# Patient Record
Sex: Female | Born: 1975 | Race: White | Hispanic: No | Marital: Married | Smoking: Former smoker
Health system: Southern US, Community
[De-identification: ages and names within clinical notes are randomized; demographics above are authoritative.]

## PROBLEM LIST (undated history)

## (undated) DIAGNOSIS — F32A Depression, unspecified: Secondary | ICD-10-CM

## (undated) DIAGNOSIS — N390 Urinary tract infection, site not specified: Secondary | ICD-10-CM

## (undated) DIAGNOSIS — F419 Anxiety disorder, unspecified: Secondary | ICD-10-CM

## (undated) DIAGNOSIS — F329 Major depressive disorder, single episode, unspecified: Secondary | ICD-10-CM

## (undated) HISTORY — DX: Anxiety disorder, unspecified: F41.9

## (undated) HISTORY — DX: Depression, unspecified: F32.A

## (undated) HISTORY — PX: OTHER SURGICAL HISTORY: SHX169

## (undated) HISTORY — DX: Major depressive disorder, single episode, unspecified: F32.9

---

## 1996-12-24 HISTORY — PX: OTHER SURGICAL HISTORY: SHX169

## 1998-10-28 ENCOUNTER — Other Ambulatory Visit: Admission: RE | Admit: 1998-10-28 | Discharge: 1998-10-28 | Payer: Self-pay | Admitting: Gynecology

## 2007-12-25 HISTORY — PX: TUBAL LIGATION: SHX77

## 2011-12-25 HISTORY — PX: WISDOM TOOTH EXTRACTION: SHX21

## 2013-07-01 ENCOUNTER — Ambulatory Visit (INDEPENDENT_AMBULATORY_CARE_PROVIDER_SITE_OTHER): Payer: BC Managed Care – PPO

## 2013-07-01 ENCOUNTER — Encounter: Payer: Self-pay | Admitting: Obstetrics & Gynecology

## 2013-07-01 ENCOUNTER — Ambulatory Visit (INDEPENDENT_AMBULATORY_CARE_PROVIDER_SITE_OTHER): Payer: BC Managed Care – PPO | Admitting: Obstetrics & Gynecology

## 2013-07-01 VITALS — BP 124/83 | HR 90 | Resp 16 | Ht 69.5 in | Wt 172.0 lb

## 2013-07-01 DIAGNOSIS — Z1151 Encounter for screening for human papillomavirus (HPV): Secondary | ICD-10-CM

## 2013-07-01 DIAGNOSIS — Z01419 Encounter for gynecological examination (general) (routine) without abnormal findings: Secondary | ICD-10-CM

## 2013-07-01 DIAGNOSIS — N939 Abnormal uterine and vaginal bleeding, unspecified: Secondary | ICD-10-CM | POA: Insufficient documentation

## 2013-07-01 DIAGNOSIS — Z124 Encounter for screening for malignant neoplasm of cervix: Secondary | ICD-10-CM

## 2013-07-01 DIAGNOSIS — N926 Irregular menstruation, unspecified: Secondary | ICD-10-CM

## 2013-07-01 NOTE — Addendum Note (Signed)
Addended by: Mariel Aloe L on: 07/01/2013 12:00 PM   Modules accepted: Orders

## 2013-07-01 NOTE — Progress Notes (Signed)
  Subjective:     Diane Elliott is a 37 y.o. female here for a routine exam.  Current complaints: painful menses x 9 months.  Pt has also been having iirgular cycles for 2 months.  The last period began 6/27 and has continued and is still present today.  Pt has a day of no bleeding then a gush of blood the next.  Pt reports one episode of post coital bleeding..  Personal health questionnaire reviewed: yes.   Gynecologic History Patient's last menstrual period was 06/18/2013. Contraception: tubal ligation Last Pap: 2008. Results were: normal Last mammogram: n/a. Obstetric History OB History   Grav Para Term Preterm Abortions TAB SAB Ect Mult Living   3 3        3      # Outc Date GA Lbr Len/2nd Wgt Sex Del Anes PTL Lv   1 PAR            2 PAR            3 PAR                The following portions of the patient's history were reviewed and updated as appropriate: allergies, current medications, past family history, past medical history, past social history, past surgical history and problem list.  Review of Systems Pertinent items are noted in HPI.    Objective:   Filed Vitals:   07/01/13 1007  BP: 124/83  Pulse: 90  Resp: 16  Height: 5' 9.5" (1.765 m)  Weight: 172 lb (78.019 kg)      Vitals:  WNL General appearance: alert, cooperative and no distress Head: Normocephalic, without obvious abnormality, atraumatic Eyes: negative Throat: lips, mucosa, and tongue normal; teeth and gums normal Lungs: clear to auscultation bilaterally Breasts: normal appearance, no masses or tenderness, No nipple retraction or dimpling, No nipple discharge or bleeding Heart: regular rate and rhythm Abdomen: soft, non-tender; bowel sounds normal; no masses,  no organomegaly Pelvic: cervix normal in appearance, external genitalia normal, no adnexal masses or tenderness, no bladder tenderness, no cervical motion tenderness, perianal skin: no external genital warts noted, urethra without abnormality  or discharge, uterus normal size, shape, and consistency and vagina normal without discharge--small amount of blood in vault Extremities: no edema, redness or tenderness in the calves or thighs Skin: no lesions or rash--pt is sunburned Lymph nodes: Axillary adenopathy: none        Assessment:    Healthy female exam.  Abnormal uterine bleeding Smoker   Plan:    Education reviewed: self breast exams, skin cancer screening and smoking cessation. Contraception: tubal ligation. Follow up in: 2 weeks. TV US to evaluate for abnml uterine bleeding

## 2013-07-08 ENCOUNTER — Telehealth: Payer: Self-pay | Admitting: *Deleted

## 2013-07-08 ENCOUNTER — Ambulatory Visit (INDEPENDENT_AMBULATORY_CARE_PROVIDER_SITE_OTHER): Payer: BC Managed Care – PPO | Admitting: Obstetrics & Gynecology

## 2013-07-08 ENCOUNTER — Encounter: Payer: Self-pay | Admitting: Obstetrics & Gynecology

## 2013-07-08 ENCOUNTER — Encounter (HOSPITAL_COMMUNITY): Payer: Self-pay | Admitting: Pharmacist

## 2013-07-08 VITALS — BP 111/71 | HR 71 | Resp 16 | Ht 69.5 in | Wt 175.0 lb

## 2013-07-08 DIAGNOSIS — Z01818 Encounter for other preprocedural examination: Secondary | ICD-10-CM

## 2013-07-08 DIAGNOSIS — N840 Polyp of corpus uteri: Secondary | ICD-10-CM | POA: Insufficient documentation

## 2013-07-08 MED ORDER — MEDROXYPROGESTERONE ACETATE 10 MG PO TABS: 10.0000 mg | ORAL_TABLET | Freq: Every day | ORAL | Status: DC

## 2013-07-08 MED ORDER — MISOPROSTOL 200 MCG PO TABS: ORAL_TABLET | ORAL | Status: DC

## 2013-07-08 NOTE — Telephone Encounter (Signed)
Per TO Dr Penne Lash, pt notified to take Provera 10 mg 2 daily until surgery and RX for Cytotec 400 MCG to be placed vaginally the night prior to procedure.  This was sent to [facility] in [facility].

## 2013-07-08 NOTE — Progress Notes (Signed)
Pt presents for f/u of menorrhagia.  US shows probably 1.4 cm endometrial polyp.  Pt is still bleeding.  We discussed surgical removal.  Pt would like to proceed.  Risks include but not limited to bleeding, infection, and damage to the uterus.  Pt had nml menses until recently.  She is not anovulatory.    Also of not, pt has a probable hemorrhagic cyst on the left ovary as below.   Will follow up in 8 weeks.  Technique: Transvaginal ultrasound examination of the pelvis was  performed including evaluation of the uterus, ovaries, adnexal  regions, and pelvic cul-de-sac.  Comparison: None.  Findings:  Uterus: Normal in size and appearance, measuring 8.6 x 4.3 x 5.2  cm.  Endometrium: 14 x 7 x 14 mm echogenic lesion within the endometrium  with associated color Doppler flow, worrisome for an endometrial  polyp. Additional fluid/debris within the endometrial cavity.  Right ovary: Normal appearance/no adnexal mass, measuring 3.6 x 2.3  x 2.9 cm.  Left ovary: Measures 5.0 x 2.8 x 3.5 cm. Associated 2.4 x 2.1 x  2.0 cm complex cyst, likely a benign hemorrhagic cyst, although  technically indeterminate given lack of a characteristic ultrasound  appearance.  Other Findings: No free fluid.  IMPRESSION:  Suspected 1.4 cm endometrial polyp with associated color Doppler  flow.  Consider further evaluation with sonohysterogram for confirmation  prior to hysteroscopy. Endometrial sampling should also be  considered if patient is at high risk for endometrial carcinoma.  (Ref: Radiological Reasoning: Algorithmic Workup of Abnormal  Vaginal Bleeding with Endovaginal Sonography and Sonohysterography.  AJR 2008; [id number]:[id number])  2.4 cm probable left ovarian complex/hemorrhagic cyst. Consider  follow-up pelvic US in 6-12 weeks as clinically warranted.  Surgery to be scheduled for next week.  Will have tru clear available if polyp is large or lesion is a submucosal myoma.  25 minutes spent face to face with  pt with >50% counseling

## 2013-07-13 ENCOUNTER — Encounter (HOSPITAL_COMMUNITY)
Admission: RE | Disposition: A | Discharge status: Home / Self Care | Payer: Self-pay | Source: Ambulatory Visit | Attending: Obstetrics & Gynecology

## 2013-07-13 ENCOUNTER — Encounter (HOSPITAL_COMMUNITY): Payer: Self-pay | Admitting: Anesthesiology

## 2013-07-13 ENCOUNTER — Ambulatory Visit (HOSPITAL_COMMUNITY)
Admission: RE | Admit: 2013-07-13 | Discharge: 2013-07-13 | Disposition: A | Discharge status: Home / Self Care | Payer: BC Managed Care – PPO | Source: Ambulatory Visit | Attending: Obstetrics & Gynecology | Admitting: Obstetrics & Gynecology

## 2013-07-13 ENCOUNTER — Encounter (HOSPITAL_COMMUNITY): Payer: Self-pay | Admitting: *Deleted

## 2013-07-13 ENCOUNTER — Ambulatory Visit (HOSPITAL_COMMUNITY): Payer: BC Managed Care – PPO | Admitting: Anesthesiology

## 2013-07-13 DIAGNOSIS — N920 Excessive and frequent menstruation with regular cycle: Secondary | ICD-10-CM | POA: Insufficient documentation

## 2013-07-13 DIAGNOSIS — N926 Irregular menstruation, unspecified: Secondary | ICD-10-CM

## 2013-07-13 DIAGNOSIS — F411 Generalized anxiety disorder: Secondary | ICD-10-CM | POA: Insufficient documentation

## 2013-07-13 DIAGNOSIS — N939 Abnormal uterine and vaginal bleeding, unspecified: Secondary | ICD-10-CM

## 2013-07-13 DIAGNOSIS — F329 Major depressive disorder, single episode, unspecified: Secondary | ICD-10-CM | POA: Insufficient documentation

## 2013-07-13 DIAGNOSIS — F17200 Nicotine dependence, unspecified, uncomplicated: Secondary | ICD-10-CM | POA: Insufficient documentation

## 2013-07-13 DIAGNOSIS — F3289 Other specified depressive episodes: Secondary | ICD-10-CM | POA: Insufficient documentation

## 2013-07-13 DIAGNOSIS — N840 Polyp of corpus uteri: Secondary | ICD-10-CM | POA: Insufficient documentation

## 2013-07-13 LAB — CBC
MCV: 87.6 fL (ref 78.0–100.0)
Platelets: 364 10*3/uL (ref 150–400)
RDW: 13.5 % (ref 11.5–15.5)
WBC: 9.4 10*3/uL (ref 4.0–10.5)

## 2013-07-13 SURGERY — DILATATION & CURETTAGE/HYSTEROSCOPY WITH TRUCLEAR
Anesthesia: General | Site: Uterus | Wound class: Clean Contaminated

## 2013-07-13 MED ORDER — MEPERIDINE HCL 25 MG/ML IJ SOLN: 6.2500 mg | INTRAMUSCULAR | Status: DC | PRN

## 2013-07-13 MED ORDER — KETOROLAC TROMETHAMINE 30 MG/ML IJ SOLN
INTRAMUSCULAR | Status: AC
  Filled 2013-07-13: qty 1

## 2013-07-13 MED ORDER — LIDOCAINE HCL (CARDIAC) 20 MG/ML IV SOLN
INTRAVENOUS | Status: AC
  Filled 2013-07-13: qty 5

## 2013-07-13 MED ORDER — KETOROLAC TROMETHAMINE 30 MG/ML IJ SOLN
INTRAMUSCULAR | Status: DC | PRN
  Administered 2013-07-13: 30 mg via INTRAVENOUS

## 2013-07-13 MED ORDER — ONDANSETRON HCL 4 MG/2ML IJ SOLN
INTRAMUSCULAR | Status: AC
  Filled 2013-07-13: qty 2

## 2013-07-13 MED ORDER — METOCLOPRAMIDE HCL 5 MG/ML IJ SOLN: 10.0000 mg | Freq: Once | INTRAMUSCULAR | Status: DC | PRN

## 2013-07-13 MED ORDER — SILVER NITRATE-POT NITRATE 75-25 % EX MISC
CUTANEOUS | Status: AC
  Filled 2013-07-13: qty 1

## 2013-07-13 MED ORDER — DEXAMETHASONE SODIUM PHOSPHATE 10 MG/ML IJ SOLN
INTRAMUSCULAR | Status: AC
  Filled 2013-07-13: qty 1

## 2013-07-13 MED ORDER — PROPOFOL 10 MG/ML IV EMUL
INTRAVENOUS | Status: AC
  Filled 2013-07-13: qty 20

## 2013-07-13 MED ORDER — IBUPROFEN 600 MG PO TABS: 600.0000 mg | ORAL_TABLET | Freq: Four times a day (QID) | ORAL | Status: DC | PRN

## 2013-07-13 MED ORDER — MIDAZOLAM HCL 5 MG/5ML IJ SOLN
INTRAMUSCULAR | Status: DC | PRN
  Administered 2013-07-13: 2 mg via INTRAVENOUS

## 2013-07-13 MED ORDER — SILVER NITRATE-POT NITRATE 75-25 % EX MISC
CUTANEOUS | Status: DC | PRN
  Administered 2013-07-13: 1

## 2013-07-13 MED ORDER — DEXAMETHASONE SODIUM PHOSPHATE 4 MG/ML IJ SOLN
INTRAMUSCULAR | Status: DC | PRN
  Administered 2013-07-13: 10 mg via INTRAVENOUS

## 2013-07-13 MED ORDER — FENTANYL CITRATE 0.05 MG/ML IJ SOLN
INTRAMUSCULAR | Status: DC | PRN
  Administered 2013-07-13: 100 ug via INTRAVENOUS
  Administered 2013-07-13 (×2): 50 ug via INTRAVENOUS

## 2013-07-13 MED ORDER — MIDAZOLAM HCL 2 MG/2ML IJ SOLN
INTRAMUSCULAR | Status: AC
  Filled 2013-07-13: qty 2

## 2013-07-13 MED ORDER — FENTANYL CITRATE 0.05 MG/ML IJ SOLN: 25.0000 ug | INTRAMUSCULAR | Status: DC | PRN

## 2013-07-13 MED ORDER — FENTANYL CITRATE 0.05 MG/ML IJ SOLN
INTRAMUSCULAR | Status: AC
  Filled 2013-07-13: qty 5

## 2013-07-13 MED ORDER — LACTATED RINGERS IV SOLN
INTRAVENOUS | Status: DC
  Administered 2013-07-13 (×2): via INTRAVENOUS
  Administered 2013-07-13: 125 mL/h via INTRAVENOUS

## 2013-07-13 MED ORDER — ONDANSETRON HCL 4 MG/2ML IJ SOLN
INTRAMUSCULAR | Status: DC | PRN
  Administered 2013-07-13: 4 mg via INTRAVENOUS

## 2013-07-13 MED ORDER — SODIUM CHLORIDE 0.9 % IR SOLN
Status: DC | PRN
  Administered 2013-07-13: 3000 mL

## 2013-07-13 MED ORDER — FENTANYL CITRATE 0.05 MG/ML IJ SOLN
INTRAMUSCULAR | Status: AC
  Filled 2013-07-13: qty 2

## 2013-07-13 MED ORDER — LIDOCAINE HCL (CARDIAC) 20 MG/ML IV SOLN
INTRAVENOUS | Status: DC | PRN
  Administered 2013-07-13: 80 mg via INTRAVENOUS

## 2013-07-13 MED ORDER — PROPOFOL 10 MG/ML IV BOLUS
INTRAVENOUS | Status: DC | PRN
  Administered 2013-07-13: 200 mg via INTRAVENOUS

## 2013-07-13 SURGICAL SUPPLY — 17 items
BLADE INCISOR TRUC PLUS 2.9 (ABLATOR) ×1 IMPLANT
CATH ROBINSON RED A/P 16FR (CATHETERS) ×2 IMPLANT
CLOTH BEACON ORANGE TIMEOUT ST (SAFETY) ×2 IMPLANT
CONTAINER PREFILL 10% NBF 60ML (FORM) IMPLANT
DRESSING TELFA 8X3 (GAUZE/BANDAGES/DRESSINGS) ×2 IMPLANT
GLOVE BIO SURGEON STRL SZ7 (GLOVE) ×2 IMPLANT
GLOVE BIOGEL PI IND STRL 7.0 (GLOVE) ×1 IMPLANT
GLOVE BIOGEL PI INDICATOR 7.0 (GLOVE) ×1
GOWN STRL REIN XL XLG (GOWN DISPOSABLE) ×4 IMPLANT
INCISOR TRUC PLUS BLADE 2.9 (ABLATOR) ×2
KIT HYSTEROSCOPY TRUCLEAR (ABLATOR) ×2 IMPLANT
NEEDLE SPNL 22GX3.5 QUINCKE BK (NEEDLE) ×2 IMPLANT
PACK VAGINAL MINOR WOMEN LF (CUSTOM PROCEDURE TRAY) ×2 IMPLANT
PAD OB MATERNITY 4.3X12.25 (PERSONAL CARE ITEMS) ×2 IMPLANT
PAD PREP 24X48 CUFFED NSTRL (MISCELLANEOUS) ×2 IMPLANT
SYR CONTROL 10ML LL (SYRINGE) ×2 IMPLANT
TOWEL OR 17X24 6PK STRL BLUE (TOWEL DISPOSABLE) ×4 IMPLANT

## 2013-07-13 NOTE — H&P (Signed)
  Full H&P was done 07/01/13.  Pt noted to have endometrial polyp on TVUS.  Pt consented for D&C, hysteroscopy, and polyp removal.  Risk include but not limited to bleeding, infection, damage to the uterus.  If peroration occurs, the procedure will have to be discontinued.  Pt understands all the above.  All questions answered.  Pt feels well today.  No complaints.  No changes to H & P.  LEGGETT,KELLY H. 2:11 PM

## 2013-07-13 NOTE — Anesthesia Postprocedure Evaluation (Signed)
Anesthesia Post Note  Patient: Diane Elliott  Procedure(s) Performed: Procedure(s) (LRB): DILATATION & CURETTAGE/HYSTEROSCOPY WITH TRUECLEAR WITH CERVICAL POLYPECTOMY (N/A)  Anesthesia type: General  Patient location: PACU  Post pain: Pain level controlled  Post assessment: Post-op Vital signs reviewed  Last Vitals:  Filed Vitals:   07/13/13 1620  BP: 100/55  Temp: 37.2 C  Resp: 16    Post vital signs: Reviewed  Level of consciousness: sedated  Complications: No apparent anesthesia complications

## 2013-07-13 NOTE — Op Note (Signed)
PREOPERATIVE DIAGNOSIS:  37 yo female with menorrhagia and suspected endometrial polyp  POSTOPERATIVE DIAGNOSIS: The same  PROCEDURE:  D & C, Hysteroscopy, Polypectomy with Tru Clear  SURGEON:  Dr. Elsie Lincoln  INDICATIONS: 37 y.o. yo G3P3  here for menorrhagia and suspected endometrial polyp   .Risks of surgery were discussed with the patient including but not limited to: bleeding which may require transfusion; infection which may require antibiotics; injury to uterus leading to risk of injury to surrounding intraperitoneal organs, need for additional procedures including laparoscopy or laparotomy, and other postoperative/anesthesia complications. Written informed consent was obtained.   FINDINGS:  A 6 size anteverted/midline/retroverted uterus.  Nml ostia bilaterally.  Polyp arising from left side of LUS.  Patient was taken to the operating room where general anesthesia was administered and was found to be adequate.  After an adequate timeout was performed, she was placed in the dorsal lithotomy position and examined; then prepped and draped in the sterile manner.   Her bladder was catheterized for an unmeasured amount of clear, yellow urine. A speculum was then placed in the patient's vagina and a single tooth tenaculum was applied to the anterior lip of the cervix.  The cervix was dilated manually with half-sized Hegar dilators to accommodate the 8 mm hysteroscope.  Once the cervix was dilated, the hysteroscope was inserted under direct visualization. The uterine cavity was carefully examined, both ostia were recognized, and there was minimal endometrial tissue.  A cervical seal test was conducted and there was no fluid loss.  The Tru Clear device was used to remove the polyp from the left wall of the endometrial cavity.  Total resection time was 30 seconds.  Thre was excellent hemostasis. The hysteroscope was removed. The tenaculum was removed from the anterior lip of the cervix, and the vaginal  speculum was removed after noting good hemostasis.  The patient tolerated the procedure well and was taken to the recovery area awake, extubated and in stable condition.  The patient will be discharged to home as per PACU criteria.  Routine postoperative instructions given.  She will follow up in my office in 2-3 weeks for postoperative evaluation .

## 2013-07-13 NOTE — Transfer of Care (Signed)
Immediate Anesthesia Transfer of Care Note  Patient: Diane Elliott  Procedure(s) Performed: Procedure(s): DILATATION & CURETTAGE/HYSTEROSCOPY WITH TRUECLEAR WITH CERVICAL POLYPECTOMY (N/A)  Patient Location: PACU  Anesthesia Type:General  Level of Consciousness: awake, alert  and oriented  Airway & Oxygen Therapy: Patient Spontanous Breathing and Patient connected to nasal cannula oxygen  Post-op Assessment: Report given to PACU RN and Post -op Vital signs reviewed and stable  Post vital signs: Reviewed and stable  Complications: No apparent anesthesia complications

## 2013-07-13 NOTE — Anesthesia Procedure Notes (Signed)
Procedure Name: LMA Insertion Date/Time: 07/13/2013 3:40 PM Performed by: Graciela Husbands Pre-anesthesia Checklist: Timeout performed, Suction available, Emergency Drugs available, Patient identified and Patient being monitored Patient Re-evaluated:Patient Re-evaluated prior to inductionOxygen Delivery Method: Circle system utilized Preoxygenation: Pre-oxygenation with 100% oxygen Intubation Type: IV induction LMA: LMA inserted LMA Size: 4.0 Number of attempts: 1 Placement Confirmation: positive ETCO2 and breath sounds checked- equal and bilateral Tube secured with: Tape Dental Injury: Teeth and Oropharynx as per pre-operative assessment

## 2013-07-13 NOTE — Anesthesia Preprocedure Evaluation (Signed)
Anesthesia Evaluation  Patient identified by MRN, date of birth, ID band Patient awake    Reviewed: Allergy & Precautions, H&P , NPO status , Patient's Chart, lab work & pertinent test results  Airway Mallampati: II TM Distance: >3 FB Neck ROM: Full    Dental no notable dental hx. (+) Teeth Intact   Pulmonary Current Smoker,  breath sounds clear to auscultation  Pulmonary exam normal       Cardiovascular negative cardio ROS  Rhythm:Regular Rate:Normal     Neuro/Psych PSYCHIATRIC DISORDERS Anxiety Depression negative neurological ROS     GI/Hepatic negative GI ROS, Neg liver ROS,   Endo/Other  negative endocrine ROS  Renal/GU negative Renal ROS  negative genitourinary   Musculoskeletal negative musculoskeletal ROS (+)   Abdominal   Peds  Hematology negative hematology ROS (+)   Anesthesia Other Findings   Reproductive/Obstetrics Endometrial polyp                           Anesthesia Physical Anesthesia Plan  ASA: II  Anesthesia Plan: General   Post-op Pain Management:    Induction: Intravenous  Airway Management Planned: LMA  Additional Equipment:   Intra-op Plan:   Post-operative Plan: Extubation in OR  Informed Consent: I have reviewed the patients History and Physical, chart, labs and discussed the procedure including the risks, benefits and alternatives for the proposed anesthesia with the patient or authorized representative who has indicated his/her understanding and acceptance.   Dental advisory given  Plan Discussed with: Anesthesiologist, CRNA and Surgeon  Anesthesia Plan Comments:         Anesthesia Quick Evaluation

## 2013-07-16 ENCOUNTER — Telehealth: Payer: Self-pay | Admitting: *Deleted

## 2013-07-16 NOTE — Telephone Encounter (Signed)
Pt notified of normal pathology from her polypectomy

## 2013-07-16 NOTE — Telephone Encounter (Signed)
Message copied by Granville Lewis on Thu Jul 16, 2013 10:23 AM ------      Message from: Lesly Dukes      Created: Wed Jul 15, 2013 10:37 PM       Benign pathology.  rn to call pt ------

## 2014-01-14 IMAGING — US US TRANSVAGINAL NON-OB
1 series · 13 of 25 positions shown · non-contrast
Comparison: None.

CLINICAL DATA: Abnormal uterine bleeding

TRANSVAGINAL ULTRASOUND OF PELVIS
TECHNIQUE: Transvaginal ultrasound examination of the pelvis was
performed including evaluation of the uterus, ovaries, adnexal
regions, and pelvic cul-de-sac.

[Series 1: us transvaginal non-ob · 0.15mm/px · 38 acquisitions, 13 frames shown]
[im 1/38]
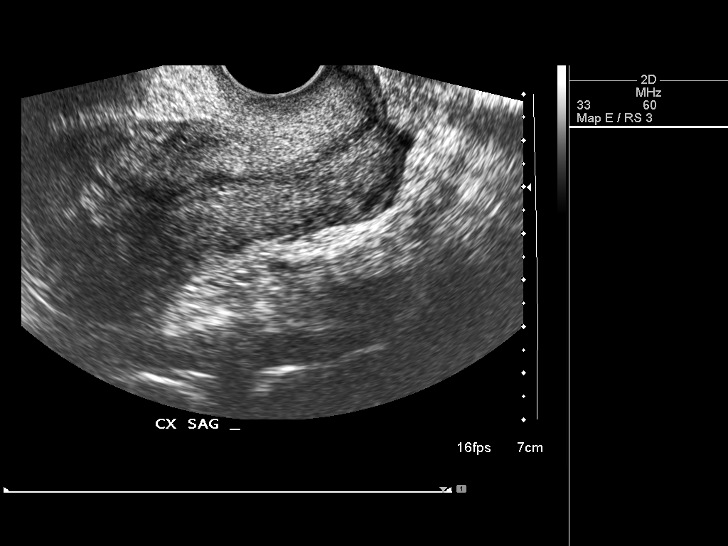
[im 4/38]
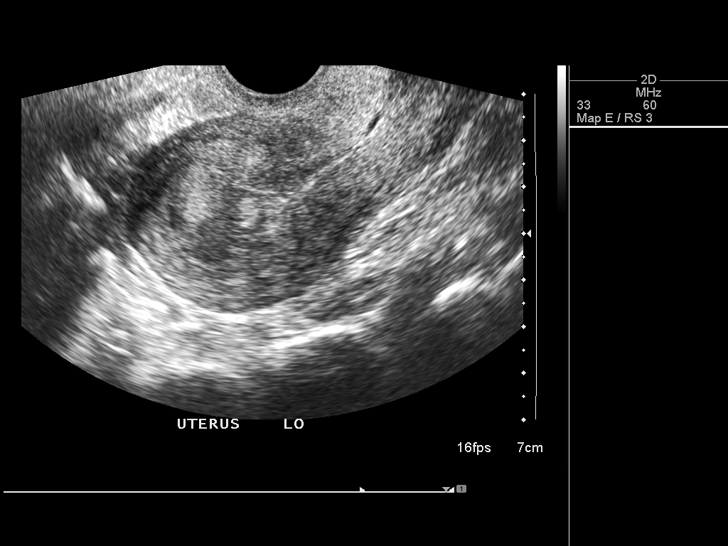
[im 7/38]
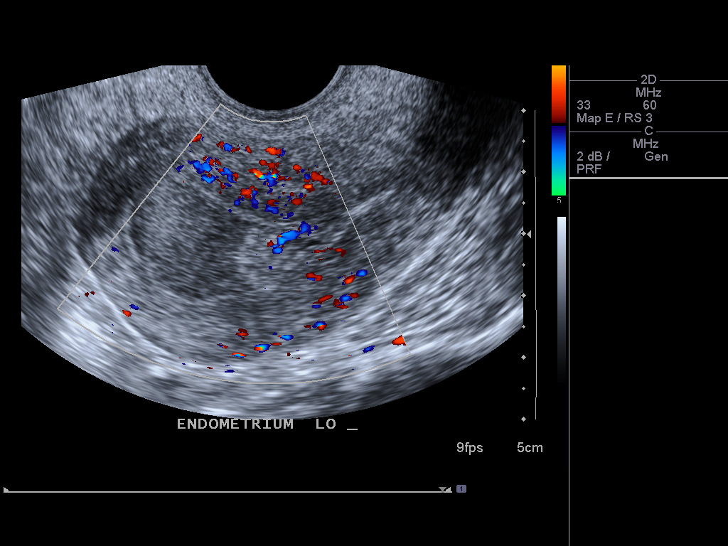
[im 10/38]
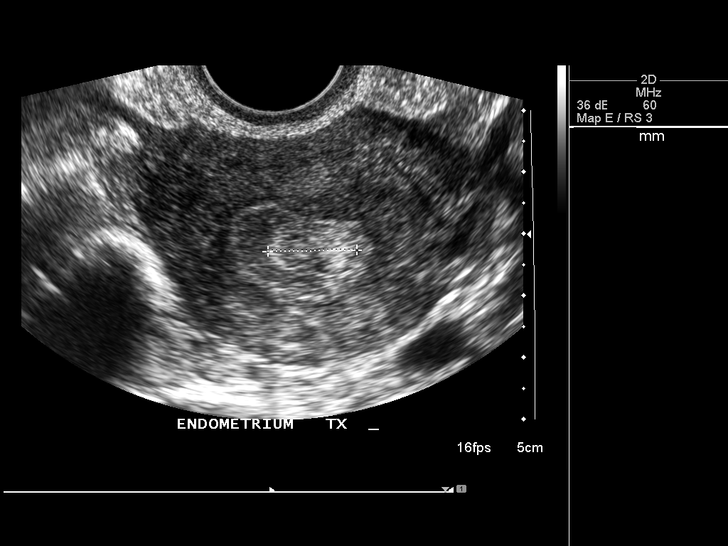
[im 13/38]
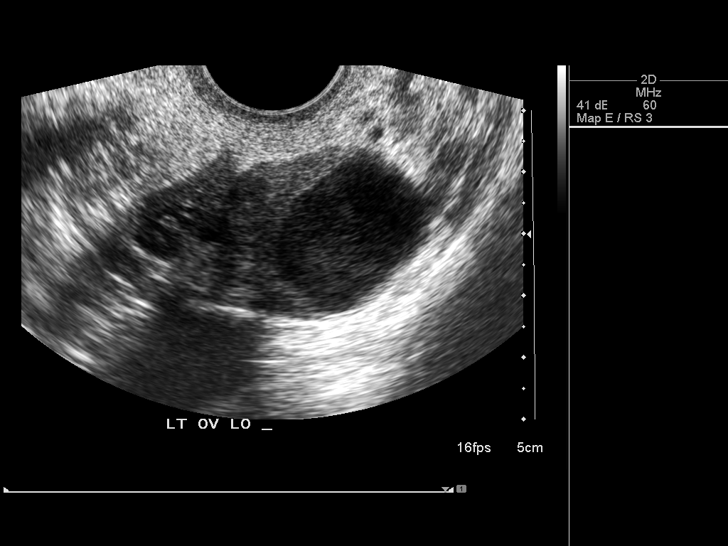
[im 16/38]
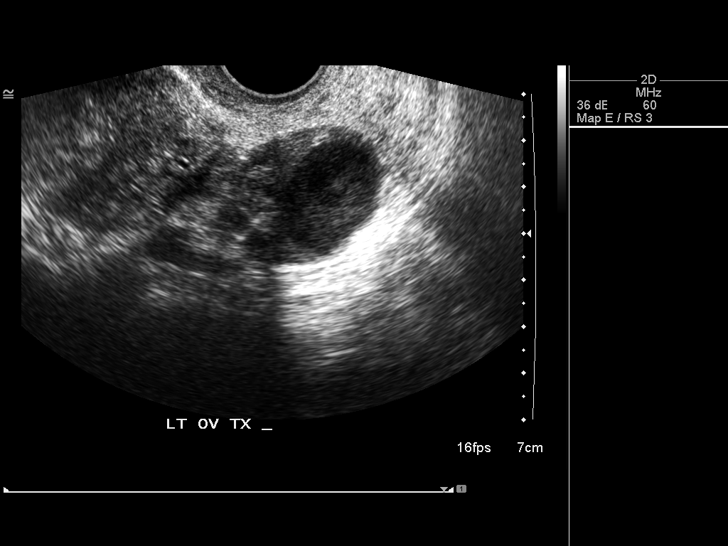
[im 19/38]
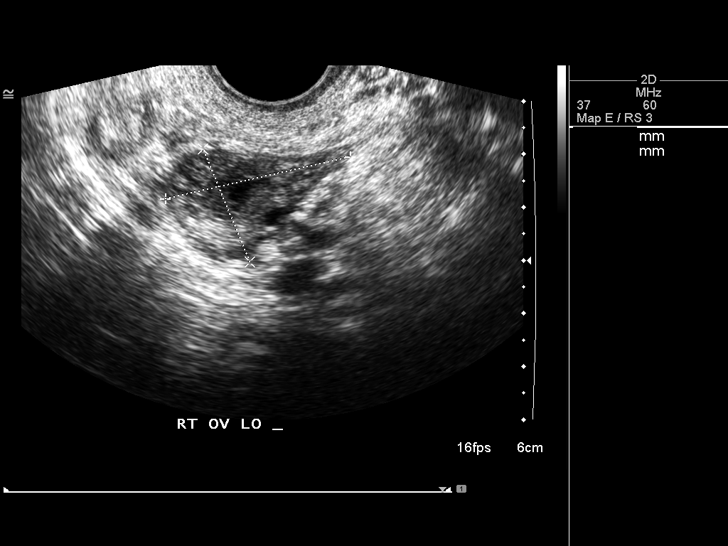
[im 22/38]
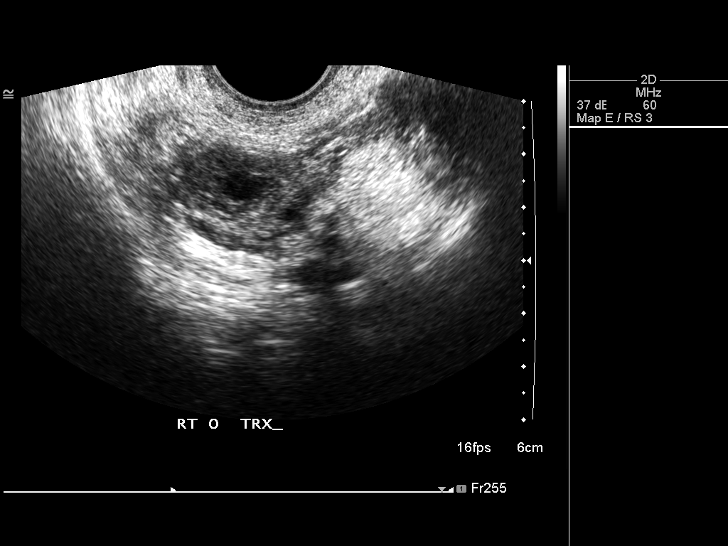
[im 25/38]
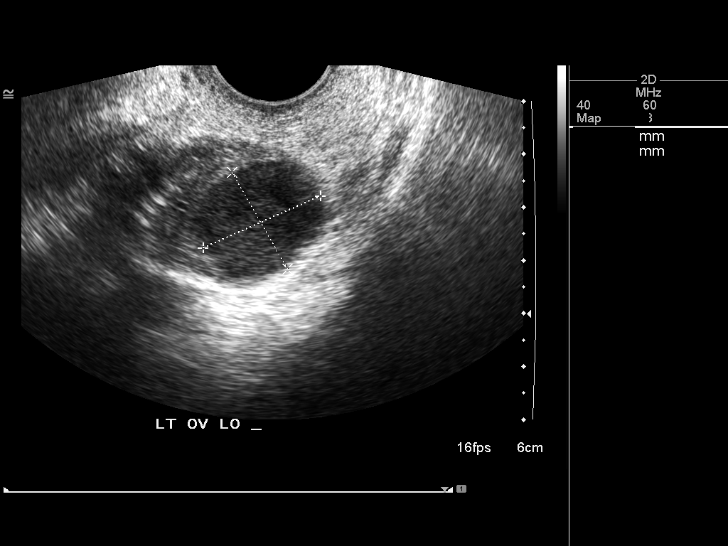
[im 28/38]
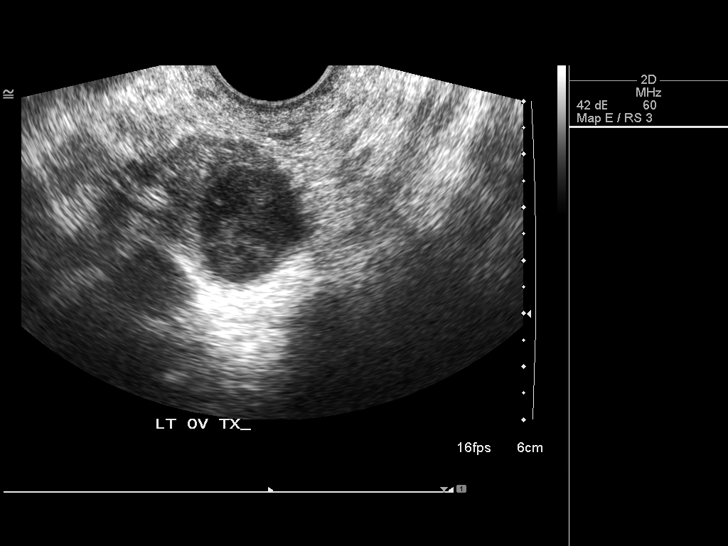
[im 31/38]
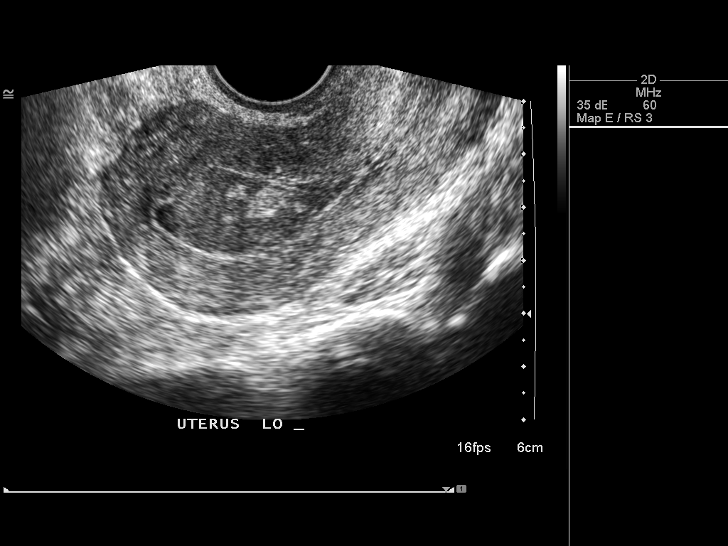
[im 34/38]
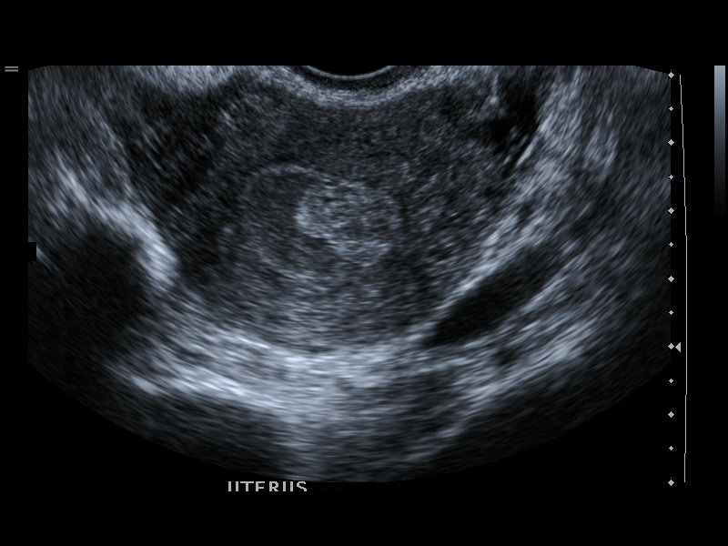
[im 38/38]
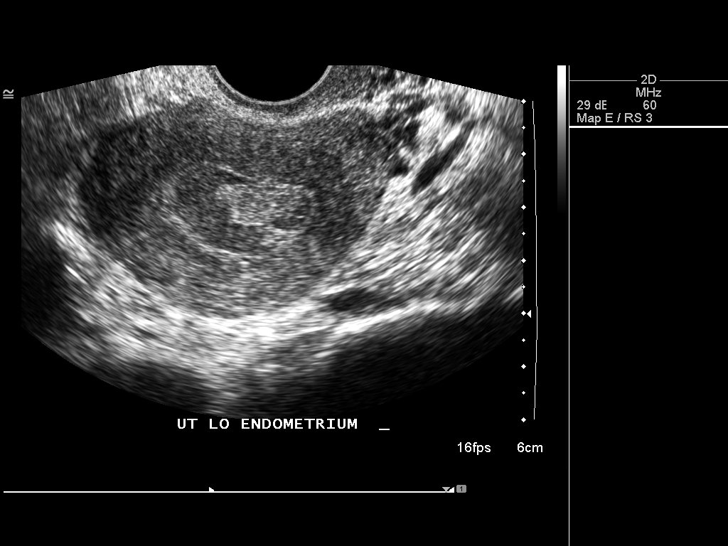

[13 of 25 positions shown; findings below may reference images not displayed]

FINDINGS: Uterus:  Normal in size and appearance, measuring 8.6 x 4.3 x
cm.

Endometrium: 14 x 7 x 14 mm echogenic lesion within the endometrium
with associated color Doppler flow, worrisome for an endometrial
polyp.  Additional fluid/debris within the endometrial cavity.

Right ovary: Normal appearance/no adnexal mass, measuring 3.6 x
x 2.9 cm.

Left ovary: Measures 5.0 x 2.8 x 3.5 cm.  Associated 2.4 x 2.1 x
2.0 cm complex cyst, likely a benign hemorrhagic cyst, although
technically indeterminate given lack of a characteristic ultrasound
appearance.

Other Findings:  No free fluid.
IMPRESSION: Suspected 1.4 cm endometrial polyp with associated color Doppler
flow.

Consider further evaluation with sonohysterogram for confirmation
prior to hysteroscopy.  Endometrial sampling should also be
considered if patient is at high risk for endometrial carcinoma.
(Ref:  Radiological Reasoning: Algorithmic Workup of Abnormal
Vaginal Bleeding with Endovaginal Sonography and Sonohysterography.
AJR [id number]; 191:S68-73)

2.4 cm probable left ovarian complex/hemorrhagic cyst.  Consider
follow-up pelvic US in 6-12 weeks as clinically warranted.

## 2014-09-25 ENCOUNTER — Emergency Department (INDEPENDENT_AMBULATORY_CARE_PROVIDER_SITE_OTHER)
Admission: EM | Admit: 2014-09-25 | Discharge: 2014-09-25 | Disposition: A | Discharge status: Home / Self Care | Payer: Managed Care, Other (non HMO) | Source: Home / Self Care | Attending: Emergency Medicine | Admitting: Emergency Medicine

## 2014-09-25 ENCOUNTER — Encounter: Payer: Self-pay | Admitting: Emergency Medicine

## 2014-09-25 DIAGNOSIS — B373 Candidiasis of vulva and vagina: Secondary | ICD-10-CM

## 2014-09-25 DIAGNOSIS — B3731 Acute candidiasis of vulva and vagina: Secondary | ICD-10-CM

## 2014-09-25 MED ORDER — FLUCONAZOLE 150 MG PO TABS: ORAL_TABLET | ORAL | Status: DC

## 2014-09-25 NOTE — ED Provider Notes (Signed)
CSN: [id number]     Arrival date & time 09/25/14  0905 History   First MD Initiated Contact with Patient 09/25/14 [id number]     Chief Complaint  Patient presents with  . Vaginal Itching   (Consider location/radiation/quality/duration/timing/severity/associated sxs/prior Treatment) HPI X 5 days, perineal/vaginal itching/burning.-Progressively worsening. Some swelling in vaginal area. 3 day Monostat ineffective. Finished Cipro about 7 days ago.  Denies vaginal discharge or foul odor. Last normal menstrual period started 2 days ago. She denies chance of pregnancy, as she status post BTL . Monogamous with husband. Denies pelvic or abdominal pain. No nausea or vomiting or fever or chills or systemic symptoms .  Past Medical History  Diagnosis Date  . Anxiety   . Depression    Past Surgical History  Procedure Laterality Date  . Tubal ligation  2009  . Bladder stem stretch  98   Family History  Problem Relation Age of Onset  . Hypertension Father   . Diverticulitis Father    History  Substance Use Topics  . Smoking status: Current Every Day Smoker -- 1.00 packs/day for 15 years    Types: Cigarettes  . Smokeless tobacco: Never Used  . Alcohol Use: No   OB History   Grav Para Term Preterm Abortions TAB SAB Ect Mult Living   3 3        3      Review of Systems  All other systems reviewed and are negative.   Allergies  Penicillins  Home Medications   Prior to Admission medications   Medication Sig Start Date End Date Taking? Authorizing Provider  buPROPion (WELLBUTRIN) 75 MG tablet Take 75 mg by mouth 2 (two) times daily.    Historical Provider, MD  cetirizine (ZYRTEC) 10 MG tablet Take 10 mg by mouth daily as needed for allergies.    Historical Provider, MD  fluconazole (DIFLUCAN) 150 MG tablet Take 2 by mouth now, then (starting tomorrow), 1 daily for a total of 7 days treatment. 09/25/14   Lajean Manes, MD  ibuprofen (ADVIL,MOTRIN) 600 MG tablet Take 1 tablet (600 mg total)  by mouth every 6 (six) hours as needed for pain. 07/13/13   Lesly Dukes, MD   BP 113/71  Pulse 65  Temp(Src) 98.5 F (36.9 C)  Ht 5' 9.5" (1.765 m)  Wt 202 lb (91.627 kg)  BMI 29.41 kg/m2  SpO2 97%  LMP 09/21/2014 Physical Exam  Nursing note and vitals reviewed. Constitutional: She is oriented to person, place, and time. She appears well-developed and well-nourished. No distress.  HENT:  Head: Normocephalic and atraumatic.  Eyes: Conjunctivae and EOM are normal. Pupils are equal, round, and reactive to light. No scleral icterus.  Neck: Normal range of motion.  Cardiovascular: Normal rate.   Pulmonary/Chest: Effort normal.  Abdominal: She exhibits no distension.  Genitourinary:     With nurse chaperone, visual inspection of perineal area. As depicted, moist erythematous rash labia majora and perineal area with a few satellite lesions/papules. No discharge. No pustules. No vesicles. The patient declined internal pelvic exam  Musculoskeletal: Normal range of motion.  Neurological: She is alert and oriented to person, place, and time.  Skin: Skin is warm.  Psychiatric: She has a normal mood and affect.    ED Course  Procedures (including critical care time) Labs Review Labs Reviewed - No data to display  Imaging Review No results found.   MDM   1. Vaginal yeast infection    by history and exam, most likely severe monilial  vulvovaginitis.  She declined any other exam or testing . Treatment options discussed, as well as risks, benefits, alternatives. Patient voiced understanding and agreement with the following plans: Diflucan 150 mg. Take 2 by mouth the first day, then 1 daily for 7 days. Other symptomatic care discussed. An After Visit Summary was printed and given to the patient.  Over 30 minutes spent, greater than 50% of the time spent for counseling and coordination of care.  She incidentally mentions chronic deformed left fungal great toenail, and I gave  her some general advice about this, but advised to see dermatologist or podiatrist  Follow-up with your gyn or primary care doctor in 5-7 days if not improving, or sooner if symptoms become worse. Precautions discussed. Red flags discussed. Questions invited and answered. Patient voiced understanding and agreement.     Lajean Manesavid Massey, MD 09/25/14 1122

## 2014-09-25 NOTE — ED Notes (Signed)
X 5 days, perineal/vaginal itching/burning.  Some swelling in vaginal area.  3 day Monostat ineffective. Finished Cipro about  7 days ago.  Painful sex.  Also asked re fungal infection of L great toe.

## 2014-09-25 NOTE — Discharge Instructions (Signed)
Candidal Vulvovaginitis Candidal vulvovaginitis is an infection of the vagina and vulva. The vulva is the skin around the opening of the vagina. This may cause itching and discomfort in and around the vagina.  HOME CARE  Only take medicine as told by your doctor.  Do not have sex (intercourse) until the infection is healed or as told by your doctor.  Practice safe sex.  Tell your sex partner about your infection.  Wear cotton underwear. Do not wear tight pants or panty hose.  Eat yogurt. This may help treat and prevent yeast infections. GET HELP RIGHT AWAY IF:   You have a fever.  Your problems get worse during treatment or do not get better in 3 days.  You have discomfort, irritation, or itching in your vagina or vulva area.  You have pain after sex.  You start to get belly (abdominal) pain. MAKE SURE YOU:  Understand these instructions.  Will watch your condition.  Will get help right away if you are not doing well or get worse.   Take the Diflucan rx by mouth as prescribed x7 days

## 2014-10-20 ENCOUNTER — Ambulatory Visit: Payer: Managed Care, Other (non HMO) | Admitting: Obstetrics & Gynecology

## 2014-10-20 DIAGNOSIS — R102 Pelvic and perineal pain: Secondary | ICD-10-CM

## 2014-10-25 ENCOUNTER — Encounter: Payer: Self-pay | Admitting: Emergency Medicine

## 2014-11-24 ENCOUNTER — Emergency Department (INDEPENDENT_AMBULATORY_CARE_PROVIDER_SITE_OTHER): Payer: Managed Care, Other (non HMO)

## 2014-11-24 ENCOUNTER — Emergency Department (INDEPENDENT_AMBULATORY_CARE_PROVIDER_SITE_OTHER)
Admission: EM | Admit: 2014-11-24 | Discharge: 2014-11-24 | Disposition: A | Discharge status: Home / Self Care | Payer: Managed Care, Other (non HMO) | Source: Home / Self Care | Attending: Family Medicine | Admitting: Family Medicine

## 2014-11-24 ENCOUNTER — Encounter: Payer: Self-pay | Admitting: Emergency Medicine

## 2014-11-24 DIAGNOSIS — M545 Low back pain: Secondary | ICD-10-CM

## 2014-11-24 DIAGNOSIS — M79604 Pain in right leg: Secondary | ICD-10-CM

## 2014-11-24 DIAGNOSIS — Z87440 Personal history of urinary (tract) infections: Secondary | ICD-10-CM

## 2014-11-24 HISTORY — DX: Urinary tract infection, site not specified: N39.0

## 2014-11-24 LAB — POCT URINALYSIS DIP (MANUAL ENTRY)
Bilirubin, UA: NEGATIVE
GLUCOSE UA: NEGATIVE
Ketones, POC UA: NEGATIVE
Leukocytes, UA: NEGATIVE
NITRITE UA: NEGATIVE
PROTEIN UA: NEGATIVE
Spec Grav, UA: 1.02 (ref 1.005–1.03)
UROBILINOGEN UA: 0.2 (ref 0–1)
pH, UA: 5.5 (ref 5–8)

## 2014-11-24 MED ORDER — PREDNISONE 20 MG PO TABS: 20.0000 mg | ORAL_TABLET | Freq: Two times a day (BID) | ORAL | Status: DC

## 2014-11-24 MED ORDER — KETOROLAC TROMETHAMINE 60 MG/2ML IM SOLN
60.0000 mg | Freq: Once | INTRAMUSCULAR | Status: AC
  Administered 2014-11-24: 60 mg via INTRAMUSCULAR

## 2014-11-24 MED ORDER — CYCLOBENZAPRINE HCL 10 MG PO TABS: 10.0000 mg | ORAL_TABLET | Freq: Two times a day (BID) | ORAL | Status: DC | PRN

## 2014-11-24 MED ORDER — HYDROCODONE-ACETAMINOPHEN 5-325 MG PO TABS
ORAL_TABLET | ORAL | Status: DC
Start: 2014-11-24 — End: 2015-07-20

## 2014-11-24 NOTE — ED Provider Notes (Signed)
CSN: [id number]     Arrival date & time 11/24/14  [id number] History   First MD Initiated Contact with Patient 11/24/14 [phone]     Chief Complaint  Patient presents with  . Back Pain  . Dysuria      HPI Comments: About 12 days ago patient lifted and pulled some heavy furniture, and afterwards developed lower back ache that has persisted.  Over the past three days she has had intermittent pain radiating to both lower legs posteriorly, worse with movement.  Her pain is worse when she stands from a sitting position.  No bowel or bladder dysfunction, and no saddle numbness. However, over the past two to three days she has noted some lower abdominal pressure with mild dysuria.  She has a history of UTI's, on prophylactic Bactrim.  Patient's last menstrual period was 11/16/2014.  No pelvic pain.  No fevers, chills, and sweats   Patient is a 38 y.o. female presenting with back pain. The history is provided by the patient.  Back Pain Location:  Lumbar spine Quality:  Aching Radiates to:  L posterior upper leg and R posterior upper leg Pain severity:  Moderate Pain is:  Same all the time Onset quality:  Sudden Duration:  12 days Timing:  Constant Progression:  Unchanged Chronicity:  New Context: lifting heavy objects   Worsened by:  Movement Ineffective treatments:  NSAIDs and heating pad Associated symptoms: dysuria and paresthesias   Associated symptoms: no abdominal pain, no abdominal swelling, no bladder incontinence, no bowel incontinence, no chest pain, no fever, no leg pain, no numbness, no pelvic pain, no perianal numbness, no tingling and no weakness   Dysuria:    Severity:  Mild   Onset quality:  Sudden   Duration:  3 days   Timing:  Intermittent   Progression:  Unchanged   Chronicity:  Recurrent   Past Medical History  Diagnosis Date  . Anxiety   . Depression   . Frequent UTI    Past Surgical History  Procedure Laterality Date  . Tubal ligation  2009  . Bladder stem stretch   98   Family History  Problem Relation Age of Onset  . Hypertension Father   . Diverticulitis Father    History  Substance Use Topics  . Smoking status: Current Every Day Smoker -- 1.00 packs/day for 15 years    Types: Cigarettes  . Smokeless tobacco: Never Used  . Alcohol Use: No   OB History    Gravida Para Term Preterm AB TAB SAB Ectopic Multiple Living   3 3        3      Review of Systems  Constitutional: Negative for fever.  Cardiovascular: Negative for chest pain.  Gastrointestinal: Negative for abdominal pain and bowel incontinence.  Genitourinary: Positive for dysuria. Negative for bladder incontinence and pelvic pain.  Musculoskeletal: Positive for back pain.  Neurological: Positive for paresthesias. Negative for tingling, weakness and numbness.  All other systems reviewed and are negative.   Allergies  Penicillins  Home Medications   Prior to Admission medications   Medication Sig Start Date End Date Taking? Authorizing Provider  sulfamethoxazole-trimethoprim (BACTRIM,SEPTRA) 400-80 MG per tablet Take 1 tablet by mouth daily.   Yes Historical Provider, MD  buPROPion (WELLBUTRIN) 75 MG tablet Take 75 mg by mouth 2 (two) times daily.    Historical Provider, MD  cetirizine (ZYRTEC) 10 MG tablet Take 10 mg by mouth daily as needed for allergies.    Historical Provider, MD  cyclobenzaprine (FLEXERIL) 10 MG tablet Take 1 tablet (10 mg total) by mouth 2 (two) times daily as needed for muscle spasms. 11/24/14   Lattie HawStephen A Rabon Scholle, MD  fluconazole (DIFLUCAN) 150 MG tablet Take 2 by mouth now, then (starting tomorrow), 1 daily for a total of 7 days treatment. 09/25/14   Lajean Manesavid Massey, MD  HYDROcodone-acetaminophen (NORCO/VICODIN) 5-325 MG per tablet Take one by mouth at bedtime as needed for pain 11/24/14   Lattie HawStephen A Jullia Mulligan, MD  ibuprofen (ADVIL,MOTRIN) 600 MG tablet Take 1 tablet (600 mg total) by mouth every 6 (six) hours as needed for pain. 07/13/13   Lesly DukesKelly H Leggett, MD    predniSONE (DELTASONE) 20 MG tablet Take 1 tablet (20 mg total) by mouth 2 (two) times daily. Take with food. 11/24/14   Lattie HawStephen A Vienne Corcoran, MD   BP 102/67 mmHg  Pulse 98  Temp(Src) 98.2 F (36.8 C) (Oral)  Resp 16  Ht 5\' 9"  (1.753 m)  Wt 190 lb (86.183 kg)  BMI 28.05 kg/m2  SpO2 98%  LMP 11/16/2014 Physical Exam  Constitutional: She is oriented to person, place, and time. She appears well-developed and well-nourished. No distress.  HENT:  Head: Atraumatic.  Mouth/Throat: Oropharynx is clear and moist.  Eyes: Conjunctivae are normal. Pupils are equal, round, and reactive to light.  Cardiovascular: Normal heart sounds.   Pulmonary/Chest: Breath sounds normal.  Abdominal: Bowel sounds are normal. There is no tenderness.  Musculoskeletal: She exhibits no edema.       Lumbar back: She exhibits decreased range of motion, tenderness and bony tenderness. She exhibits no swelling and no edema.       Back:  Lower back has midline tenderness from approximately L3 to sacral level as noted on diagram. Can heel/toe walk and squat without difficulty.  Decreased forward flexion.  Straight leg raising test is negative.  Sitting knee extension test is negative.  Strength and sensation in the lower extremities is normal.  Patellar and achilles reflexes are normal    Lymphadenopathy:    She has no cervical adenopathy.  Neurological: She is alert and oriented to person, place, and time.  Skin: Skin is warm and dry. No rash noted.  Nursing note and vitals reviewed.   ED Course  Procedures  none    Labs Reviewed  POCT URINALYSIS DIP (MANUAL ENTRY):  BLO trace intact; otherwise negative    Imaging Review Dg Lumbar Spine Complete  11/24/2014   CLINICAL DATA:  Low back pain since moving furniture a few weeks ago.  EXAM: LUMBAR SPINE - COMPLETE 4+ VIEW  COMPARISON:  None.  FINDINGS: There is no evidence of lumbar spine fracture. Alignment is normal. Intervertebral disc spaces are maintained.   IMPRESSION: Negative exam.   Electronically Signed   By: Drusilla Kannerhomas  Dalessio M.D.   On: 11/24/2014 10:40     MDM   1. History of recurrent UTIs   2. Low back pain radiating to both legs    No evidence UTI today; will send urine culture Begin prednisone burst and Flexeril.  Lortab at bedtime prn. Apply ice pack for 20 to 30 minutes, 3 to 4 times daily  Continue until pain decreases.  Begin back exercises in about five days. Followup with Dr. Rodney Langtonhomas Thekkekand[facility] ([facility]) if not improving about two weeks.     Lattie HawStephen A Namrata Dangler, MD 11/28/14 [phone]

## 2014-11-24 NOTE — Discharge Instructions (Signed)
Apply ice pack for 20 to 30 minutes, 3 to 4 times daily  Continue until pain decreases.  Begin back exercises in about five days.

## 2014-11-24 NOTE — ED Notes (Signed)
Reports moving furniture about 1 1/2 weeks ago; has had lower bilateral back pain for past week. Coincidentally has dysuria; frequent UTI history on maintenance abx.

## 2014-11-26 LAB — URINE CULTURE
Colony Count: NO GROWTH
ORGANISM ID, BACTERIA: NO GROWTH

## 2014-11-29 ENCOUNTER — Telehealth: Payer: Self-pay | Admitting: Emergency Medicine

## 2015-07-20 ENCOUNTER — Ambulatory Visit (INDEPENDENT_AMBULATORY_CARE_PROVIDER_SITE_OTHER): Payer: BLUE CROSS/BLUE SHIELD | Admitting: Obstetrics & Gynecology

## 2015-07-20 ENCOUNTER — Encounter: Payer: Self-pay | Admitting: Obstetrics & Gynecology

## 2015-07-20 VITALS — BP 113/70 | HR 76 | Resp 16 | Ht 69.5 in | Wt 202.0 lb

## 2015-07-20 DIAGNOSIS — N946 Dysmenorrhea, unspecified: Secondary | ICD-10-CM

## 2015-07-20 DIAGNOSIS — N938 Other specified abnormal uterine and vaginal bleeding: Secondary | ICD-10-CM | POA: Diagnosis not present

## 2015-07-20 NOTE — Progress Notes (Signed)
   Subjective:    Patient ID: Diane Elliott, female    DOB: 1976/09/02, 39 y.o.   MRN: [id number]  HPI  39 yo MW P3 (15, 46, and 58 yo kids) here today with the complaint of irregular bleeding. She has been having periods every 2-3 weeks for the last 5 months. She had this same problem 2 years ago and had polyps removed via hysteroscopy by Dr. Penne Lash.  She also complains of terrible cramping with her period as well as "all the time" pelvic pain. She has tried 800 mg of IBU, pamprin, heat with no help. This has been going on for at least 8 months.  Her mom has endometriosis.    Review of Systems She had a BTL for contraception. 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 She also has 3 step kids. She is a Futures trader. She is a smoker.    Objective:   Physical Exam  [id number] Ambulating, conversing, and breathing normally      Assessment & Plan:  CPP/severe dysmenorrhea- I suspect endometriosis DUB/suspect a return of polyps Schedule h/s, d&c, Novasure and laparoscopy

## 2015-07-26 ENCOUNTER — Telehealth: Payer: Self-pay | Admitting: *Deleted

## 2015-07-26 NOTE — Telephone Encounter (Signed)
Pt called stating that she had seen Dr Marice Potter last week and is scheduled for surgery.  She wants her surgery ASAP.  I explained that we can only scheduled surgery during scheduled OR time.  I explained that she would be on a cancellation list and would be called if something came up earlier.  Discussed with Dr Marice Potter and she is aware.

## 2015-07-29 ENCOUNTER — Encounter (HOSPITAL_COMMUNITY): Payer: Self-pay | Admitting: *Deleted

## 2015-08-10 DIAGNOSIS — F419 Anxiety disorder, unspecified: Secondary | ICD-10-CM | POA: Diagnosis not present

## 2015-08-10 DIAGNOSIS — Z79899 Other long term (current) drug therapy: Secondary | ICD-10-CM | POA: Diagnosis not present

## 2015-08-10 DIAGNOSIS — N801 Endometriosis of ovary: Secondary | ICD-10-CM | POA: Diagnosis not present

## 2015-08-10 DIAGNOSIS — N938 Other specified abnormal uterine and vaginal bleeding: Secondary | ICD-10-CM | POA: Diagnosis not present

## 2015-08-10 DIAGNOSIS — Z880 Allergy status to penicillin: Secondary | ICD-10-CM | POA: Diagnosis not present

## 2015-08-10 DIAGNOSIS — N808 Other endometriosis: Secondary | ICD-10-CM | POA: Diagnosis not present

## 2015-08-10 DIAGNOSIS — R102 Pelvic and perineal pain: Secondary | ICD-10-CM | POA: Diagnosis present

## 2015-08-10 DIAGNOSIS — Z87440 Personal history of urinary (tract) infections: Secondary | ICD-10-CM | POA: Diagnosis not present

## 2015-08-10 DIAGNOSIS — F329 Major depressive disorder, single episode, unspecified: Secondary | ICD-10-CM | POA: Diagnosis not present

## 2015-08-10 DIAGNOSIS — F17210 Nicotine dependence, cigarettes, uncomplicated: Secondary | ICD-10-CM | POA: Diagnosis not present

## 2015-08-10 DIAGNOSIS — N946 Dysmenorrhea, unspecified: Secondary | ICD-10-CM | POA: Diagnosis not present

## 2015-08-10 MED ORDER — DOXYCYCLINE HYCLATE 100 MG IV SOLR
200.0000 mg | INTRAVENOUS | Status: AC
  Administered 2015-08-11: 200 mg via INTRAVENOUS
  Filled 2015-08-10: qty 200

## 2015-08-11 ENCOUNTER — Encounter (HOSPITAL_COMMUNITY)
Admission: RE | Disposition: A | Discharge status: Home / Self Care | Payer: Self-pay | Source: Ambulatory Visit | Attending: Obstetrics & Gynecology

## 2015-08-11 ENCOUNTER — Encounter (HOSPITAL_COMMUNITY): Payer: Self-pay | Admitting: *Deleted

## 2015-08-11 ENCOUNTER — Ambulatory Visit (HOSPITAL_COMMUNITY)
Admission: RE | Admit: 2015-08-11 | Discharge: 2015-08-11 | Disposition: A | Discharge status: Home / Self Care | Payer: BLUE CROSS/BLUE SHIELD | Source: Ambulatory Visit | Attending: Obstetrics & Gynecology | Admitting: Obstetrics & Gynecology

## 2015-08-11 ENCOUNTER — Ambulatory Visit (HOSPITAL_COMMUNITY): Payer: BLUE CROSS/BLUE SHIELD | Admitting: Anesthesiology

## 2015-08-11 DIAGNOSIS — N801 Endometriosis of ovary: Secondary | ICD-10-CM | POA: Insufficient documentation

## 2015-08-11 DIAGNOSIS — Z87440 Personal history of urinary (tract) infections: Secondary | ICD-10-CM | POA: Insufficient documentation

## 2015-08-11 DIAGNOSIS — N950 Postmenopausal bleeding: Secondary | ICD-10-CM | POA: Diagnosis not present

## 2015-08-11 DIAGNOSIS — Z880 Allergy status to penicillin: Secondary | ICD-10-CM | POA: Insufficient documentation

## 2015-08-11 DIAGNOSIS — N946 Dysmenorrhea, unspecified: Secondary | ICD-10-CM | POA: Insufficient documentation

## 2015-08-11 DIAGNOSIS — N808 Other endometriosis: Secondary | ICD-10-CM | POA: Diagnosis not present

## 2015-08-11 DIAGNOSIS — Z79899 Other long term (current) drug therapy: Secondary | ICD-10-CM | POA: Insufficient documentation

## 2015-08-11 DIAGNOSIS — F17210 Nicotine dependence, cigarettes, uncomplicated: Secondary | ICD-10-CM | POA: Insufficient documentation

## 2015-08-11 DIAGNOSIS — F419 Anxiety disorder, unspecified: Secondary | ICD-10-CM | POA: Insufficient documentation

## 2015-08-11 DIAGNOSIS — N938 Other specified abnormal uterine and vaginal bleeding: Secondary | ICD-10-CM | POA: Insufficient documentation

## 2015-08-11 DIAGNOSIS — F329 Major depressive disorder, single episode, unspecified: Secondary | ICD-10-CM | POA: Insufficient documentation

## 2015-08-11 DIAGNOSIS — R102 Pelvic and perineal pain: Secondary | ICD-10-CM | POA: Insufficient documentation

## 2015-08-11 HISTORY — PX: DILITATION & CURRETTAGE/HYSTROSCOPY WITH NOVASURE ABLATION: SHX5568

## 2015-08-11 HISTORY — PX: LAPAROSCOPY: SHX197

## 2015-08-11 LAB — CBC
HCT: 39.7 % (ref 36.0–46.0)
Hemoglobin: 13.2 g/dL (ref 12.0–15.0)
MCH: 29.5 pg (ref 26.0–34.0)
MCHC: 33.2 g/dL (ref 30.0–36.0)
MCV: 88.6 fL (ref 78.0–100.0)
PLATELETS: 418 10*3/uL — AB (ref 150–400)
RBC: 4.48 MIL/uL (ref 3.87–5.11)
RDW: 14.7 % (ref 11.5–15.5)
WBC: 8 10*3/uL (ref 4.0–10.5)

## 2015-08-11 LAB — PREGNANCY, URINE: Preg Test, Ur: NEGATIVE

## 2015-08-11 SURGERY — DILATATION & CURETTAGE/HYSTEROSCOPY WITH NOVASURE ABLATION
Anesthesia: General | Site: Vagina

## 2015-08-11 MED ORDER — ONDANSETRON HCL 4 MG/2ML IJ SOLN
INTRAMUSCULAR | Status: DC | PRN
  Administered 2015-08-11: 4 mg via INTRAVENOUS

## 2015-08-11 MED ORDER — SCOPOLAMINE 1 MG/3DAYS TD PT72
MEDICATED_PATCH | TRANSDERMAL | Status: AC
  Administered 2015-08-11: 1.5 mg via TRANSDERMAL
  Filled 2015-08-11: qty 1

## 2015-08-11 MED ORDER — KETOROLAC TROMETHAMINE 30 MG/ML IJ SOLN
INTRAMUSCULAR | Status: AC
  Filled 2015-08-11: qty 1

## 2015-08-11 MED ORDER — OXYCODONE-ACETAMINOPHEN 5-325 MG PO TABS
1.0000 | ORAL_TABLET | ORAL | Status: DC | PRN
  Administered 2015-08-11: 1 via ORAL

## 2015-08-11 MED ORDER — ROCURONIUM BROMIDE 100 MG/10ML IV SOLN
INTRAVENOUS | Status: DC | PRN
  Administered 2015-08-11: 30 mg via INTRAVENOUS

## 2015-08-11 MED ORDER — NEOSTIGMINE METHYLSULFATE 10 MG/10ML IV SOLN
INTRAVENOUS | Status: DC | PRN
  Administered 2015-08-11: 2 mg via INTRAVENOUS

## 2015-08-11 MED ORDER — MIDAZOLAM HCL 2 MG/2ML IJ SOLN
INTRAMUSCULAR | Status: AC
Start: 2015-08-11 — End: 2015-08-11
  Filled 2015-08-11: qty 2

## 2015-08-11 MED ORDER — LIDOCAINE HCL (CARDIAC) 20 MG/ML IV SOLN
INTRAVENOUS | Status: AC
  Filled 2015-08-11: qty 5

## 2015-08-11 MED ORDER — MIDAZOLAM HCL 2 MG/2ML IJ SOLN
INTRAMUSCULAR | Status: DC | PRN
  Administered 2015-08-11: 2 mg via INTRAVENOUS

## 2015-08-11 MED ORDER — PROPOFOL 10 MG/ML IV BOLUS
INTRAVENOUS | Status: DC | PRN
  Administered 2015-08-11: 200 mg via INTRAVENOUS

## 2015-08-11 MED ORDER — LACTATED RINGERS IR SOLN
Status: DC | PRN
  Administered 2015-08-11: 3000 mL

## 2015-08-11 MED ORDER — BUPIVACAINE HCL (PF) 0.5 % IJ SOLN
INTRAMUSCULAR | Status: DC | PRN
  Administered 2015-08-11: 8 mL

## 2015-08-11 MED ORDER — FENTANYL CITRATE (PF) 100 MCG/2ML IJ SOLN
INTRAMUSCULAR | Status: DC
Start: 2015-08-11 — End: 2015-08-11
  Filled 2015-08-11: qty 2

## 2015-08-11 MED ORDER — ACETAMINOPHEN 160 MG/5ML PO SOLN
975.0000 mg | Freq: Once | ORAL | Status: AC
  Administered 2015-08-11: 975 mg via ORAL

## 2015-08-11 MED ORDER — FENTANYL CITRATE (PF) 100 MCG/2ML IJ SOLN
25.0000 ug | INTRAMUSCULAR | Status: DC | PRN
  Administered 2015-08-11 (×3): 50 ug via INTRAVENOUS

## 2015-08-11 MED ORDER — DEXAMETHASONE SODIUM PHOSPHATE 4 MG/ML IJ SOLN
INTRAMUSCULAR | Status: DC | PRN
  Administered 2015-08-11: 4 mg via INTRAVENOUS

## 2015-08-11 MED ORDER — ONDANSETRON HCL 4 MG/2ML IJ SOLN
INTRAMUSCULAR | Status: AC
  Filled 2015-08-11: qty 2

## 2015-08-11 MED ORDER — FENTANYL CITRATE (PF) 250 MCG/5ML IJ SOLN
INTRAMUSCULAR | Status: DC | PRN
  Administered 2015-08-11 (×3): 50 ug via INTRAVENOUS

## 2015-08-11 MED ORDER — LIDOCAINE HCL (CARDIAC) 20 MG/ML IV SOLN
INTRAVENOUS | Status: DC | PRN
  Administered 2015-08-11: 100 mg via INTRAVENOUS

## 2015-08-11 MED ORDER — FENTANYL CITRATE (PF) 250 MCG/5ML IJ SOLN
INTRAMUSCULAR | Status: AC
  Filled 2015-08-11: qty 25

## 2015-08-11 MED ORDER — LACTATED RINGERS IV SOLN
INTRAVENOUS | Status: DC
  Administered 2015-08-11 (×2): via INTRAVENOUS

## 2015-08-11 MED ORDER — PROPOFOL 10 MG/ML IV BOLUS
INTRAVENOUS | Status: AC
  Filled 2015-08-11: qty 20

## 2015-08-11 MED ORDER — SCOPOLAMINE 1 MG/3DAYS TD PT72
1.0000 | MEDICATED_PATCH | Freq: Once | TRANSDERMAL | Status: DC
  Administered 2015-08-11: 1.5 mg via TRANSDERMAL

## 2015-08-11 MED ORDER — KETOROLAC TROMETHAMINE 30 MG/ML IJ SOLN
INTRAMUSCULAR | Status: DC | PRN
  Administered 2015-08-11: 30 mg via INTRAVENOUS

## 2015-08-11 MED ORDER — FENTANYL CITRATE (PF) 100 MCG/2ML IJ SOLN
INTRAMUSCULAR | Status: DC | PRN
  Administered 2015-08-11 (×2): 50 ug via INTRAVENOUS
  Administered 2015-08-11: 100 ug via INTRAVENOUS

## 2015-08-11 MED ORDER — IBUPROFEN 600 MG PO TABS: 600.0000 mg | ORAL_TABLET | Freq: Four times a day (QID) | ORAL | Status: DC | PRN

## 2015-08-11 MED ORDER — OXYCODONE-ACETAMINOPHEN 5-325 MG PO TABS: 1.0000 | ORAL_TABLET | Freq: Four times a day (QID) | ORAL | Status: DC | PRN

## 2015-08-11 MED ORDER — OXYCODONE-ACETAMINOPHEN 5-325 MG PO TABS
ORAL_TABLET | ORAL | Status: AC
  Filled 2015-08-11: qty 1

## 2015-08-11 MED ORDER — NEOSTIGMINE METHYLSULFATE 10 MG/10ML IV SOLN
INTRAVENOUS | Status: AC
  Filled 2015-08-11: qty 1

## 2015-08-11 MED ORDER — GLYCOPYRROLATE 0.2 MG/ML IJ SOLN
INTRAMUSCULAR | Status: AC
  Filled 2015-08-11: qty 2

## 2015-08-11 MED ORDER — ACETAMINOPHEN 160 MG/5ML PO SOLN
ORAL | Status: AC
  Administered 2015-08-11: 975 mg via ORAL
  Filled 2015-08-11: qty 40.6

## 2015-08-11 MED ORDER — BUPIVACAINE HCL (PF) 0.5 % IJ SOLN
INTRAMUSCULAR | Status: AC
  Filled 2015-08-11: qty 30

## 2015-08-11 MED ORDER — FENTANYL CITRATE (PF) 100 MCG/2ML IJ SOLN
INTRAMUSCULAR | Status: AC
  Filled 2015-08-11: qty 2

## 2015-08-11 MED ORDER — GLYCOPYRROLATE 0.2 MG/ML IJ SOLN
INTRAMUSCULAR | Status: DC | PRN
  Administered 2015-08-11: 0.1 mg via INTRAVENOUS
  Administered 2015-08-11: 0.4 mg via INTRAVENOUS

## 2015-08-11 SURGICAL SUPPLY — 39 items
ABLATOR ENDOMETRIAL BIPOLAR (ABLATOR) ×3 IMPLANT
APPLICATOR COTTON TIP 6IN STRL (MISCELLANEOUS) ×3 IMPLANT
BNDG COHESIVE 3X5 WHT NS (GAUZE/BANDAGES/DRESSINGS) ×3 IMPLANT
CABLE HIGH FREQUENCY MONO STRZ (ELECTRODE) IMPLANT
CATH ROBINSON RED A/P 16FR (CATHETERS) ×3 IMPLANT
CLOTH BEACON ORANGE TIMEOUT ST (SAFETY) ×3 IMPLANT
DRSG COVADERM PLUS 2X2 (GAUZE/BANDAGES/DRESSINGS) ×6 IMPLANT
DRSG OPSITE POSTOP 3X4 (GAUZE/BANDAGES/DRESSINGS) ×3 IMPLANT
DURAPREP 26ML APPLICATOR (WOUND CARE) ×6 IMPLANT
ELECT REM PT RETURN 9FT ADLT (ELECTROSURGICAL) ×3
ELECTRODE REM PT RTRN 9FT ADLT (ELECTROSURGICAL) ×2 IMPLANT
GLOVE BIO SURGEON STRL SZ 6.5 (GLOVE) ×3 IMPLANT
GOWN STRL REUS W/TWL LRG LVL3 (GOWN DISPOSABLE) ×9 IMPLANT
NDL SAFETY ECLIPSE 18X1.5 (NEEDLE) ×2 IMPLANT
NEEDLE HYPO 18GX1.5 SHARP (NEEDLE) ×1
NEEDLE INSUFFLATION 120MM (ENDOMECHANICALS) ×3 IMPLANT
NEEDLE SPNL 18GX3.5 QUINCKE PK (NEEDLE) ×3 IMPLANT
NS IRRIG 1000ML POUR BTL (IV SOLUTION) ×3 IMPLANT
PACK LAPAROSCOPY BASIN (CUSTOM PROCEDURE TRAY) ×3 IMPLANT
PACK VAGINAL MINOR WOMEN LF (CUSTOM PROCEDURE TRAY) ×3 IMPLANT
PAD OB MATERNITY 4.3X12.25 (PERSONAL CARE ITEMS) ×3 IMPLANT
PAD POSITIONING PINK XL (MISCELLANEOUS) ×3 IMPLANT
POUCH SPECIMEN RETRIEVAL 10MM (ENDOMECHANICALS) IMPLANT
SET IRRIG TUBING LAPAROSCOPIC (IRRIGATION / IRRIGATOR) ×3 IMPLANT
SHEARS HARMONIC ACE PLUS 36CM (ENDOMECHANICALS) IMPLANT
STRIP CLOSURE SKIN 1/2X4 (GAUZE/BANDAGES/DRESSINGS) ×3 IMPLANT
SUT VICRYL 0 ENDOLOOP (SUTURE) IMPLANT
SUT VICRYL 0 UR6 27IN ABS (SUTURE) ×9 IMPLANT
SUT VICRYL 4-0 PS2 18IN ABS (SUTURE) ×9 IMPLANT
SYR 30ML LL (SYRINGE) ×3 IMPLANT
TOWEL OR 17X24 6PK STRL BLUE (TOWEL DISPOSABLE) ×6 IMPLANT
TROCAR OPTI TIP 5M 100M (ENDOMECHANICALS) ×3 IMPLANT
TROCAR XCEL DIL TIP R 11M (ENDOMECHANICALS) ×3 IMPLANT
TROCAR XCEL NON-BLD 11X100MML (ENDOMECHANICALS) ×3 IMPLANT
TROCAR XCEL NON-BLD 5MMX100MML (ENDOMECHANICALS) ×3 IMPLANT
TUBING AQUILEX INFLOW (TUBING) ×3 IMPLANT
TUBING AQUILEX OUTFLOW (TUBING) ×3 IMPLANT
WARMER LAPAROSCOPE (MISCELLANEOUS) ×3 IMPLANT
WATER STERILE IRR 1000ML POUR (IV SOLUTION) ×3 IMPLANT

## 2015-08-11 NOTE — Discharge Instructions (Signed)

## 2015-08-11 NOTE — Anesthesia Preprocedure Evaluation (Addendum)
Anesthesia Evaluation  Patient identified by MRN, date of birth, ID band Patient awake    Reviewed: Allergy & Precautions, H&P , Patient's Chart, lab work & pertinent test results, reviewed documented beta blocker date and time   Airway Mallampati: II  TM Distance: >3 FB Neck ROM: full    Dental no notable dental hx.    Pulmonary Current Smoker,  breath sounds clear to auscultation  Pulmonary exam normal       Cardiovascular Rhythm:regular Rate:Normal     Neuro/Psych    GI/Hepatic   Endo/Other    Renal/GU      Musculoskeletal   Abdominal   Peds  Hematology   Anesthesia Other Findings   Reproductive/Obstetrics                             Anesthesia Physical Anesthesia Plan  ASA: II  Anesthesia Plan: General   Post-op Pain Management:    Induction: Intravenous  Airway Management Planned: Oral ETT  Additional Equipment:   Intra-op Plan:   Post-operative Plan: Extubation in OR  Informed Consent: I have reviewed the patients History and Physical, chart, labs and discussed the procedure including the risks, benefits and alternatives for the proposed anesthesia with the patient or authorized representative who has indicated his/her understanding and acceptance.   Dental Advisory Given and Dental advisory given  Plan Discussed with: CRNA and Surgeon  Anesthesia Plan Comments: (  Discussed general anesthesia, including possible nausea, instrumentation of airway, sore throat,pulmonary aspiration, etc. I asked if the were any outstanding questions, or  concerns before we proceeded. )        Anesthesia Quick Evaluation  

## 2015-08-11 NOTE — Op Note (Signed)
08/11/2015  10:27 AM  PATIENT:  Diane Elliott  39 y.o. female  PRE-OPERATIVE DIAGNOSIS:  CHRONIC PELVIC PAIN, SEVERE DYSMENORRHEA, DUB  POST-OPERATIVE DIAGNOSIS:  chronic pelvic pain, severe dysmenorrhea  PROCEDURE:  Procedure(s): DILATATION & CURETTAGE/HYSTEROSCOPY WITH NOVASURE ABLATION (N/A) LAPAROSCOPY DIAGNOSTIC (N/A)  SURGEON:  Surgeon(s) and Role:    * Allie Bossier, MD - Primary   ANESTHESIA:   general  EBL:  Total I/O In: 1000 [I.V.:1000] Out: 80 [Urine:75; Blood:5]  BLOOD ADMINISTERED:none  DRAINS: none   LOCAL MEDICATIONS USED:  MARCAINE     SPECIMEN:  Source of Specimen:  peritoneal biopsies and uterine curettings  DISPOSITION OF SPECIMEN:  PATHOLOGY  COUNTS:  YES  TOURNIQUET:  * No tourniquets in log *  DICTATION: .Dragon Dictation  PLAN OF CARE: Discharge to home after PACU  PATIENT DISPOSITION:  PACU - hemodynamically stable.   Delay start of Pharmacological VTE agent (>24hrs) due to surgical blood loss or risk of bleeding: not applicable   The risks, benefits, and alternatives of surgery were explained, accepted, and understood. Consents were signed. She was taken to the operating room and placed in the dorsal lithotomy position. When she was comfortable, general anesthesia was applied without complication. Her abdomen and vagina were prepped and draped in the usual sterile fashion. A bimanual exam revealed a small anteverted uterus, her adnexa were not palpable. A Hulka manipulator was placed. Her bladder was drained with a Robinson catheter. Gloves were changed and attention was turned to the abdomen. 0.5% Marcaine was used to inject at all the incision sites prior to making an incision. A 10 mm incision was made in the umbilicus. A varies needle was placed intraperitoneally. Low-flow CO2 was used to insufflate the abdomen to approximately 3 L. After an excellent pneumoperitoneum was established a 10 mm trocar was placed. Laparoscopy confirmed correct  placement. Under direct laparoscopic visualization I placed a 5 mm port in the left lower quadrant. She was placed in the Trendelenburg position. The pelvis was inspected. Both adnexa were normal. Her uterus was noted to be normal. There were endometriosis lesions on several peritoneal surfaces including over the right ureter and in the right and left ovarian fossas. I removed the lesions with the exception of the one on the ureter. All sites were hemostatic.   The pelvis was inspected and noted to be hemostatic. The CO2 was allowed to escape from the abdomen. The fascia at the umbilicus was closed with a 0 Vicryl figure-of-eight suture. No defects were palpable. A subcuticular closure was done with 4-0 Vicryl at all of the skin incisions.      A total of 30 mL of 0.5% Marcaine was used to perform a paracervical block. Her uterus sounded to 9 cm. Her cervix was carefully and slowly dilated to accommodate a small curette. Hysteroscopy showed a normal shaggy endometrium. A curettage was done in all quadrants and the fundus of the uterus. A moderate amount of  tissue was obtained. A gritty sensation was appreciated throughout.  At this point I proceeded with a Novasure endometrial ablation. The device ran for 60 seconds after passing its test. There was no bleeding noted at the end of the case. She was taken to the recovery room after being extubated. She tolerated the procedure well.   She was extubated and taken to the recovery room in stable condition.

## 2015-08-11 NOTE — Anesthesia Procedure Notes (Signed)
Procedure Name: Intubation Date/Time: 08/11/2015 9:21 AM Performed by: Graciela Husbands Pre-anesthesia Checklist: Patient identified, Emergency Drugs available, Suction available, Patient being monitored and Timeout performed Patient Re-evaluated:Patient Re-evaluated prior to inductionOxygen Delivery Method: Circle system utilized Preoxygenation: Pre-oxygenation with 100% oxygen Intubation Type: IV induction Ventilation: Mask ventilation without difficulty and Oral airway inserted - appropriate to patient size Laryngoscope Size: Mac and 3 Grade View: Grade I Tube type: Oral Endobronchial tube: Right Tube size: 7.0 mm Number of attempts: 1 Airway Equipment and Method: Stylet Placement Confirmation: ETT inserted through vocal cords under direct vision Secured at: 23 cm Tube secured with: Tape Dental Injury: Teeth and Oropharynx as per pre-operative assessment

## 2015-08-11 NOTE — H&P (Signed)
Diane Elliott is an 39 yo MW P3 (15, 28, and 35 yo kids) here today with the complaint of irregular bleeding. She has been having periods every 2-3 weeks for the last 5 months. She had this same problem 2 years ago and had polyps removed via hysteroscopy by Dr. Penne Lash.  She also complains of terrible cramping with her period as well as "all the time" pelvic pain. She has tried 800 mg of IBU, pamprin, heat with no help. This has been going on for at least 8 months.  Her mom has endometriosis.   Patient's last menstrual period was 07/11/2015.    Past Medical History  Diagnosis Date  . Anxiety   . Depression   . Frequent UTI   . Vaginal delivery 2000, 2003, 2009    Past Surgical History  Procedure Laterality Date  . Tubal ligation  2009  . Bladder stem stretch  98  . Wisdom tooth extraction  2013    Family History  Problem Relation Age of Onset  . Hypertension Father   . Diverticulitis Father     Social History:  reports that she has been smoking Cigarettes.  She has a 15 pack-year smoking history. She has never used smokeless tobacco. She reports that she does not drink alcohol or use illicit drugs.  Allergies:  Allergies  Allergen Reactions  . Penicillins Hives    Prescriptions prior to admission  Medication Sig Dispense Refill Last Dose  . Aspirin-Acetaminophen-Caffeine (GOODYS EXTRA STRENGTH PO) Take 1 packet by mouth 2 (two) times daily as needed (pain).   08/10/2015 at Unknown time  . dicyclomine (BENTYL) 20 MG tablet Take 20 mg by mouth 3 (three) times daily before meals.   08/10/2015 at Unknown time  . escitalopram (LEXAPRO) 20 MG tablet Take 20 mg by mouth daily.   08/11/2015 at Unknown time  . pantoprazole (PROTONIX) 20 MG tablet Take 20 mg by mouth daily.   08/09/2015 at Unknown time    ROS  Last menstrual period 07/11/2015. Physical Exam  Heart- rrr Lungs- CTAB Abd- benign  Results for orders placed or performed during the hospital encounter of 08/11/15  (from the past 24 hour(s))  Pregnancy, urine     Status: None   Collection Time: 08/11/15  8:12 AM  Result Value Ref Range   Preg Test, Ur NEGATIVE NEGATIVE  CBC     Status: Abnormal   Collection Time: 08/11/15  8:12 AM  Result Value Ref Range   WBC 8.0 4.0 - 10.5 K/uL   RBC 4.48 3.87 - 5.11 MIL/uL   Hemoglobin 13.2 12.0 - 15.0 g/dL   HCT 16.1 09.6 - 04.5 %   MCV 88.6 78.0 - 100.0 fL   MCH 29.5 26.0 - 34.0 pg   MCHC 33.2 30.0 - 36.0 g/dL   RDW 40.9 81.1 - 91.4 %   Platelets 418 (H) 150 - 400 K/uL    No results found.  Assessment/Plan: Chronic pelvic pain- plan for laparoscopy DUB- plan for d&c, hysteroscopy, endometrial ablation  She understands the risks of surgery, including, but not to infection, bleeding, DVTs, damage to bowel, bladder, ureters. She wishes to proceed.     Verdene Creson C. 08/11/2015, 8:39 AM

## 2015-08-11 NOTE — Transfer of Care (Signed)
Immediate Anesthesia Transfer of Care Note  Patient: Diane Elliott  Procedure(s) Performed: Procedure(s): DILATATION & CURETTAGE/HYSTEROSCOPY WITH NOVASURE ABLATION (N/A) LAPAROSCOPY DIAGNOSTIC (N/A)  Patient Location: PACU  Anesthesia Type:General  Level of Consciousness: awake  Airway & Oxygen Therapy: Patient Spontanous Breathing  Post-op Assessment: Report given to PACU RN  Post vital signs: stable  Filed Vitals:   08/11/15 0845  BP: 112/60  Pulse: 72  Temp: 36.9 C  Resp: 18    Complications: No apparent anesthesia complications

## 2015-08-11 NOTE — Anesthesia Postprocedure Evaluation (Signed)
  Anesthesia Post-op Note  Patient: Diane Elliott  Procedure(s) Performed: Procedure(s): DILATATION & CURETTAGE/HYSTEROSCOPY WITH NOVASURE ABLATION (N/A) LAPAROSCOPY DIAGNOSTIC (N/A)  Patient Location: PACU  Anesthesia Type:General  Level of Consciousness: awake and alert   Airway and Oxygen Therapy: Patient Spontanous Breathing  Post-op Pain: mild  Post-op Assessment: Post-op Vital signs reviewed, Patient's Cardiovascular Status Stable, Respiratory Function Stable, Patent Airway and No signs of Nausea or vomiting              Post-op Vital Signs: Reviewed and stable  Last Vitals:  Filed Vitals:   08/11/15 1115  BP: 104/65  Pulse: 62  Temp:   Resp: 16    Complications: No apparent anesthesia complications

## 2015-08-12 ENCOUNTER — Encounter (HOSPITAL_COMMUNITY): Payer: Self-pay | Admitting: Obstetrics & Gynecology

## 2015-11-14 ENCOUNTER — Encounter: Payer: Self-pay | Admitting: *Deleted

## 2015-11-14 ENCOUNTER — Emergency Department (INDEPENDENT_AMBULATORY_CARE_PROVIDER_SITE_OTHER)
Admission: EM | Admit: 2015-11-14 | Discharge: 2015-11-14 | Disposition: A | Discharge status: Home / Self Care | Payer: BLUE CROSS/BLUE SHIELD | Source: Home / Self Care | Attending: Family Medicine | Admitting: Family Medicine

## 2015-11-14 DIAGNOSIS — N390 Urinary tract infection, site not specified: Secondary | ICD-10-CM | POA: Diagnosis not present

## 2015-11-14 MED ORDER — PHENAZOPYRIDINE HCL 200 MG PO TABS: 200.0000 mg | ORAL_TABLET | Freq: Three times a day (TID) | ORAL | Status: DC

## 2015-11-14 MED ORDER — NITROFURANTOIN MONOHYD MACRO 100 MG PO CAPS: 100.0000 mg | ORAL_CAPSULE | Freq: Two times a day (BID) | ORAL | Status: DC

## 2015-11-14 NOTE — Discharge Instructions (Signed)
Increase fluid intake. ° ° °Urinary Tract Infection °Urinary tract infections (UTIs) can develop anywhere along your urinary tract. Your urinary tract is your body's drainage system for removing wastes and extra water. Your urinary tract includes two kidneys, two ureters, a bladder, and a urethra. Your kidneys are a pair of bean-shaped organs. Each kidney is about the size of your fist. They are located below your ribs, one on each side of your spine. °CAUSES °Infections are caused by microbes, which are microscopic organisms, including fungi, viruses, and bacteria. These organisms are so small that they can only be seen through a microscope. Bacteria are the microbes that most commonly cause UTIs. °SYMPTOMS  °Symptoms of UTIs may vary by age and gender of the patient and by the location of the infection. Symptoms in young women typically include a frequent and intense urge to urinate and a painful, burning feeling in the bladder or urethra during urination. Older women and men are more likely to be tired, shaky, and weak and have muscle aches and abdominal pain. A fever may mean the infection is in your kidneys. Other symptoms of a kidney infection include pain in your back or sides below the ribs, nausea, and vomiting. °DIAGNOSIS °To diagnose a UTI, your caregiver will ask you about your symptoms. Your caregiver will also ask you to provide a urine sample. The urine sample will be tested for bacteria and white blood cells. White blood cells are made by your body to help fight infection. °TREATMENT  °Typically, UTIs can be treated with medication. Because most UTIs are caused by a bacterial infection, they usually can be treated with the use of antibiotics. The choice of antibiotic and length of treatment depend on your symptoms and the type of bacteria causing your infection. °HOME CARE INSTRUCTIONS °· If you were prescribed antibiotics, take them exactly as your caregiver instructs you. Finish the medication even  if you feel better after you have only taken some of the medication. °· Drink enough water and fluids to keep your urine clear or pale yellow. °· Avoid caffeine, tea, and carbonated beverages. They tend to irritate your bladder. °· Empty your bladder often. Avoid holding urine for long periods of time. °· Empty your bladder before and after sexual intercourse. °· After a bowel movement, women should cleanse from front to back. Use each tissue only once. °SEEK MEDICAL CARE IF:  °· You have back pain. °· You develop a fever. °· Your symptoms do not begin to resolve within 3 days. °SEEK IMMEDIATE MEDICAL CARE IF:  °· You have severe back pain or lower abdominal pain. °· You develop chills. °· You have nausea or vomiting. °· You have continued burning or discomfort with urination. °MAKE SURE YOU:  °· Understand these instructions. °· Will watch your condition. °· Will get help right away if you are not doing well or get worse. °  °This information is not intended to replace advice given to you by your health care provider. Make sure you discuss any questions you have with your health care provider. °  °Document Released: 09/19/2005 Document Revised: 08/31/2015 Document Reviewed: 01/18/2012 °[employer] Interactive Patient Education ©2016 [employer] ° ° ° °

## 2015-11-14 NOTE — ED Provider Notes (Signed)
CSN: [id number]     Arrival date & time 11/14/15  0827 History   First MD Initiated Contact with Patient 11/14/15 0930     Chief Complaint  Patient presents with  . Urinary Tract Infection      HPI Comments: Patient developed urinary frequency one week ago, followed by dysuria, nocturia, urgency, and hesitancy.  No abdominal or pelvic pain.  No fevers, chills, and sweats.  No LMP recorded. Patient is not currently having periods    Patient is a 39 y.o. female presenting with dysuria. The history is provided by the patient.  Dysuria Pain quality:  Burning Pain severity:  Mild Onset quality:  Gradual Duration:  1 week Timing:  Constant Progression:  Unchanged Chronicity:  New Recent urinary tract infections: no   Relieved by:  Phenazopyridine Urinary symptoms: frequent urination and hesitancy   Urinary symptoms: no discolored urine, no foul-smelling urine, no hematuria and no bladder incontinence   Associated symptoms: no abdominal pain, no fever, no flank pain, no genital lesions, no nausea, no vaginal discharge and no vomiting     Past Medical History  Diagnosis Date  . Anxiety   . Depression   . Frequent UTI   . Vaginal delivery 2000, 2003, 2009   Past Surgical History  Procedure Laterality Date  . Tubal ligation  2009  . Bladder stem stretch  98  . Wisdom tooth extraction  2013  . Dilitation & currettage/hystroscopy with novasure ablation N/A 08/11/2015    Procedure: DILATATION & CURETTAGE/HYSTEROSCOPY WITH NOVASURE ABLATION;  Surgeon: Allie BossierMyra C Dove, MD;  Location: [facility];  Service: Gynecology;  Laterality: N/A;  . Laparoscopy N/A 08/11/2015    Procedure: LAPAROSCOPY DIAGNOSTIC;  Surgeon: Allie BossierMyra C Dove, MD;  Location: [facility];  Service: Gynecology;  Laterality: N/A;  . Gastric ulcer     Family History  Problem Relation Age of Onset  . Hypertension Father   . Diverticulitis Father    Social History  Substance Use Topics  . Smoking status: Current Every Day Smoker -- 1.00  packs/day for 15 years    Types: Cigarettes  . Smokeless tobacco: Never Used  . Alcohol Use: No   OB History    Gravida Para Term Preterm AB TAB SAB Ectopic Multiple Living   3 3        3      Review of Systems  Constitutional: Negative for fever.  Gastrointestinal: Negative for nausea, vomiting and abdominal pain.  Genitourinary: Positive for dysuria. Negative for flank pain and vaginal discharge.  All other systems reviewed and are negative.   Allergies  Penicillins  Home Medications   Prior to Admission medications   Medication Sig Start Date End Date Taking? Authorizing Provider  escitalopram (LEXAPRO) 20 MG tablet Take 20 mg by mouth daily.   Yes Historical Provider, MD  pantoprazole (PROTONIX) 20 MG tablet Take 20 mg by mouth daily.   Yes Historical Provider, MD  dicyclomine (BENTYL) 20 MG tablet Take 20 mg by mouth 3 (three) times daily before meals.    Historical Provider, MD  nitrofurantoin, macrocrystal-monohydrate, (MACROBID) 100 MG capsule Take 1 capsule (100 mg total) by mouth 2 (two) times daily. Take with food. 11/14/15   Lattie HawStephen A Dylana Shaw, MD  phenazopyridine (PYRIDIUM) 200 MG tablet Take 1 tablet (200 mg total) by mouth 3 (three) times daily. Take after meals. 11/14/15   Lattie HawStephen A Marlyss Cissell, MD   Meds Ordered and Administered this Visit  Medications - No data to display  BP 120/73  mmHg  Pulse 81  Temp(Src) 98.6 F (37 C) (Oral)  Resp 16  Wt 204 lb (92.534 kg)  SpO2 95% No data found.   Physical Exam Nursing notes and Vital Signs reviewed. Appearance:  Patient appears stated age, and in no acute distress Eyes:  Pupils are equal, round, and reactive to light and accomodation.  Extraocular movement is intact.  Conjunctivae are not inflamed  Nose:  Normal Pharynx:  Normal; moist mucous membranes   Neck:  Supple.  No adenopathy Lungs:  Clear to auscultation.  Breath sounds are equal.  Moving air well. Heart:  Regular rate and rhythm without murmurs, rubs, or  gallops.  Abdomen:  Nontender without masses or hepatosplenomegaly.  Bowel sounds are present.  No CVA or flank tenderness.  Extremities:  No edema.   Skin:  No rash present.   ED Course  Procedures  None    Labs Reviewed  URINE CULTURE     MDM   1. Urinary tract infection without hematuria, site unspecified    Urine culture pending. Begin Macrobid  BID for one week. Rx for Pyridium Increase fluid intake. If symptoms become significantly worse during the night or over the weekend, proceed to the local emergency room.  Followup with Family Doctor if not improved in one week.     Lattie Haw, MD 11/14/15 [phone]

## 2015-11-14 NOTE — ED Notes (Addendum)
Pt c/o dysuria, polyuria, flank pain and possible low grade fever x 1 week. Strong h/o utis. Taken AZO. Taken tylenol this AM

## 2015-11-15 LAB — POCT URINALYSIS DIP (MANUAL ENTRY)
BILIRUBIN UA: NEGATIVE
Bilirubin, UA: NEGATIVE
GLUCOSE UA: NEGATIVE
Leukocytes, UA: NEGATIVE
Nitrite, UA: NEGATIVE
PROTEIN UA: NEGATIVE
SPEC GRAV UA: 1.01 (ref 1.005–1.03)
UROBILINOGEN UA: 0.2 (ref 0–1)
pH, UA: 6.5 (ref 5–8)

## 2015-11-16 ENCOUNTER — Telehealth: Payer: Self-pay | Admitting: *Deleted

## 2015-11-16 LAB — URINE CULTURE: Colony Count: 50000

## 2015-11-18 ENCOUNTER — Telehealth: Payer: Self-pay | Admitting: Emergency Medicine

## 2015-12-05 ENCOUNTER — Encounter: Payer: Self-pay | Admitting: *Deleted

## 2015-12-05 ENCOUNTER — Emergency Department (INDEPENDENT_AMBULATORY_CARE_PROVIDER_SITE_OTHER)
Admission: EM | Admit: 2015-12-05 | Discharge: 2015-12-05 | Disposition: A | Discharge status: Home / Self Care | Payer: BLUE CROSS/BLUE SHIELD | Source: Home / Self Care | Attending: Family Medicine | Admitting: Family Medicine

## 2015-12-05 DIAGNOSIS — R1032 Left lower quadrant pain: Secondary | ICD-10-CM

## 2015-12-05 DIAGNOSIS — R1031 Right lower quadrant pain: Secondary | ICD-10-CM | POA: Diagnosis not present

## 2015-12-05 DIAGNOSIS — R319 Hematuria, unspecified: Secondary | ICD-10-CM

## 2015-12-05 DIAGNOSIS — R300 Dysuria: Secondary | ICD-10-CM | POA: Diagnosis not present

## 2015-12-05 MED ORDER — CEPHALEXIN 500 MG PO CAPS: 500.0000 mg | ORAL_CAPSULE | Freq: Two times a day (BID) | ORAL | Status: DC

## 2015-12-05 MED ORDER — PHENAZOPYRIDINE HCL 200 MG PO TABS: 200.0000 mg | ORAL_TABLET | Freq: Three times a day (TID) | ORAL | Status: DC

## 2015-12-05 NOTE — ED Notes (Addendum)
Pt c/o dysuria, hematuria and bladder pain since yesterady. She was treated for UTI here on 11/14/15. She completed her antibiotic but feels it did not completely clear. Afebrile. Taken AZO for discomfort.

## 2015-12-05 NOTE — ED Provider Notes (Signed)
CSN: [id number]     Arrival date & time 12/05/15  4098 History   First MD Initiated Contact with Patient 12/05/15 0845     Chief Complaint  Patient presents with  . Dysuria  . Hematuria   (Consider location/radiation/quality/duration/timing/severity/associated sxs/prior Treatment) HPI Pt is a 39yo female presenting to [facility] with c/o dysuria, hematuria, urinary frequency and lower abdominal pain and pressure since yesterday.  Pt was seen at the end of last month and treated for a UTI with macrobid.  Pt states symptoms nearly resolved but then came back yesterday.  Pain is 7/10 burning and stabbing with urination. She reports last UTI prior to November's was several months ago.  Pt states she was on a daily low dose antibiotic to help prevent UTIs but her PCP moved recently so she is looking for a new PCP.  She has been taking OTC Azo for current symptoms since yesterday with moderate relief.  She also notes having emergency abdominal surgery 3 months ago for a stomach ulcer.  She was then on antibiotics for a collapsed lung and pneumonia but that has all resolved.  Denies fever, chills, n/v/d. Denies new back pain. Denies vaginal symptoms.  Past Medical History  Diagnosis Date  . Anxiety   . Depression   . Frequent UTI   . Vaginal delivery 2000, 2003, 2009   Past Surgical History  Procedure Laterality Date  . Tubal ligation  2009  . Bladder stem stretch  98  . Wisdom tooth extraction  2013  . Dilitation & currettage/hystroscopy with novasure ablation N/A 08/11/2015    Procedure: DILATATION & CURETTAGE/HYSTEROSCOPY WITH NOVASURE ABLATION;  Surgeon: Allie Bossier, MD;  Location: [facility];  Service: Gynecology;  Laterality: N/A;  . Laparoscopy N/A 08/11/2015    Procedure: LAPAROSCOPY DIAGNOSTIC;  Surgeon: Allie Bossier, MD;  Location: [facility];  Service: Gynecology;  Laterality: N/A;  . Gastric ulcer     Family History  Problem Relation Age of Onset  . Hypertension Father   . Diverticulitis Father     Social History  Substance Use Topics  . Smoking status: Current Every Day Smoker -- 1.00 packs/day for 15 years    Types: Cigarettes  . Smokeless tobacco: Never Used  . Alcohol Use: No   OB History    Gravida Para Term Preterm AB TAB SAB Ectopic Multiple Living   Review of Systems  Constitutional: Negative for fever and chills.  HENT: Negative for congestion, ear pain, sore throat, trouble swallowing and voice change.   Respiratory: Negative for cough and shortness of breath.   Cardiovascular: Negative for chest pain and palpitations.  Gastrointestinal: Positive for abdominal pain ( lower). Negative for nausea, vomiting and diarrhea.  Genitourinary: Positive for dysuria, urgency, frequency, hematuria and pelvic pain. Negative for decreased urine volume, vaginal bleeding, vaginal discharge and vaginal pain.  Musculoskeletal: Negative for myalgias, back pain and arthralgias.  Skin: Negative for rash.  All other systems reviewed and are negative.   Allergies  Penicillins  Home Medications   Prior to Admission medications   Medication Sig Start Date End Date Taking? Authorizing Provider  cephALEXin (KEFLEX) 500 MG capsule Take 1 capsule (500 mg total) by mouth 2 (two) times daily. For 7 days 12/05/15   Junius Finner, PA-C  dicyclomine (BENTYL) 20 MG tablet Take 20 mg by mouth 3 (three) times daily before meals.    Historical Provider, MD  escitalopram (  LEXAPRO) 20 MG tablet Take 20 mg by mouth daily.    Historical Provider, MD  pantoprazole (PROTONIX) 20 MG tablet Take 20 mg by mouth daily.    Historical Provider, MD  phenazopyridine (PYRIDIUM) 200 MG tablet Take 1 tablet (200 mg total) by mouth 3 (three) times daily. 12/05/15   Junius FinnerErin O'Malley, PA-C   Meds Ordered and Administered this Visit  Medications - No data to display  BP 133/80 mmHg  Pulse 74  Temp(Src) 98.8 F (37.1 C) (Oral)  Resp 16  SpO2 96% No data found.   Physical Exam  Constitutional:  She is oriented to person, place, and time. She appears well-developed and well-nourished.  HENT:  Head: Normocephalic and atraumatic.  Eyes: EOM are normal.  Neck: Normal range of motion.  Cardiovascular: Normal rate.   Pulmonary/Chest: Effort normal.  Abdominal: Soft. She exhibits no distension and no mass. There is no tenderness. There is no rebound, no guarding and no CVA tenderness.  Well healing vertical surgical scar.   Musculoskeletal: Normal range of motion.  Neurological: She is alert and oriented to person, place, and time.  Skin: Skin is warm and dry.  Psychiatric: She has a normal mood and affect. Her behavior is normal.  Nursing note and vitals reviewed.   ED Course  Procedures (including critical care time)  Labs Review Labs Reviewed  URINE CULTURE    Imaging Review No results found.    MDM   1. Dysuria   2. Hematuria   3. Bilateral lower abdominal discomfort    Pt with hx of recurrent UTIs [facility] symptoms c/w prior UTIs. Pt was on Macrobid for 1 week just last month for same.  Will change antibiotic.  Urine culture sent. Resources for PCP provided. F/u with PCP or [facility] if symptoms not improving in 4-5 days, sooner if worsening. Patient verbalized understanding and agreement with treatment plan.     Junius Finnerrin O'Malley, PA-C 12/05/15 [phone]

## 2015-12-07 LAB — URINE CULTURE: Colony Count: 30000

## 2015-12-08 ENCOUNTER — Telehealth: Payer: Self-pay | Admitting: *Deleted

## 2015-12-14 MED ORDER — CIPROFLOXACIN HCL 250 MG PO TABS: 250.0000 mg | ORAL_TABLET | Freq: Two times a day (BID) | ORAL | Status: DC

## 2018-04-28 ENCOUNTER — Emergency Department (INDEPENDENT_AMBULATORY_CARE_PROVIDER_SITE_OTHER)
Admission: EM | Admit: 2018-04-28 | Discharge: 2018-04-28 | Disposition: A | Discharge status: Home / Self Care | Payer: BLUE CROSS/BLUE SHIELD | Source: Home / Self Care | Attending: Family Medicine | Admitting: Family Medicine

## 2018-04-28 ENCOUNTER — Other Ambulatory Visit: Payer: Self-pay

## 2018-04-28 ENCOUNTER — Encounter: Payer: Self-pay | Admitting: *Deleted

## 2018-04-28 DIAGNOSIS — R300 Dysuria: Secondary | ICD-10-CM

## 2018-04-28 DIAGNOSIS — N3090 Cystitis, unspecified without hematuria: Secondary | ICD-10-CM

## 2018-04-28 LAB — POCT URINALYSIS DIP (MANUAL ENTRY)
Glucose, UA: 100 mg/dL — AB
Nitrite, UA: POSITIVE — AB
SPEC GRAV UA: 1.015 (ref 1.010–1.025)
Urobilinogen, UA: 2 E.U./dL — AB
pH, UA: 5 (ref 5.0–8.0)

## 2018-04-28 MED ORDER — CIPROFLOXACIN HCL 250 MG PO TABS: 250.0000 mg | ORAL_TABLET | Freq: Two times a day (BID) | ORAL | 0 refills | Status: DC

## 2018-04-28 MED ORDER — NITROFURANTOIN MACROCRYSTAL 50 MG PO CAPS: ORAL_CAPSULE | ORAL | 0 refills | Status: DC

## 2018-04-28 NOTE — ED Provider Notes (Signed)
[name]    CSN: [id number] Arrival date & time: 04/28/18  1651     History   Chief Complaint Chief Complaint  Patient presents with  . Dysuria    HPI Diane Elliott is a 42 y.o. female.   Patient has a history of recurring UTI for which she takes prophylactic Macrodantin  daily.  However, she ran out of her med one month ago.  She complains of onset of dysuria three days ago.  No fevers, chills, and sweats. No abdominal, flank, or pelvic pain  The history is provided by the patient.  Dysuria  Pain quality:  Burning Pain severity:  Mild Onset quality:  Sudden Duration:  3 days Timing:  Constant Progression:  Worsening Chronicity:  Recurrent Recent urinary tract infections: yes   Relieved by:  Phenazopyridine Worsened by:  Nothing Ineffective treatments:  None tried Urinary symptoms: frequent urination and hesitancy   Urinary symptoms: no discolored urine, no foul-smelling urine, no hematuria and no bladder incontinence   Associated symptoms: no abdominal pain, no fever, no flank pain, no nausea, no vaginal discharge and no vomiting   Risk factors: recurrent urinary tract infections     Past Medical History:  Diagnosis Date  . Anxiety   . Depression   . Frequent UTI   . Vaginal delivery 2000, 2003, 2009    Patient Active Problem List   Diagnosis Date Noted  . Endometrial polyp 07/08/2013  . Abnormal uterine bleeding 07/01/2013    Past Surgical History:  Procedure Laterality Date  . bladder stem stretch  98  . DILITATION & CURRETTAGE/HYSTROSCOPY WITH NOVASURE ABLATION N/A 08/11/2015   Procedure: DILATATION & CURETTAGE/HYSTEROSCOPY WITH NOVASURE ABLATION;  Surgeon: Allie Bossier, MD;  Location: [facility];  Service: Gynecology;  Laterality: N/A;  . gastric ulcer    . LAPAROSCOPY N/A 08/11/2015   Procedure: LAPAROSCOPY DIAGNOSTIC;  Surgeon: Allie Bossier, MD;  Location: [facility];  Service: Gynecology;  Laterality: N/A;  . TUBAL LIGATION  2009  . WISDOM  TOOTH EXTRACTION  2013    OB History    Gravida  3   Para  3   Term      Preterm      AB      Living  3     SAB      TAB      Ectopic      Multiple      Live Births               Home Medications    Prior to Admission medications   Medication Sig Start Date End Date Taking? Authorizing Provider  acyclovir (ZOVIRAX) 200 MG capsule TAKE 1 CAPSULE BY MOUTH FIVE TIMES A DAY FOR THE FIRST FIVE DAYS OF OUTBREAK 09/15/17  Yes [provider]  buPROPion (WELLBUTRIN SR) 150 MG 12 hr tablet  04/27/18   [provider]  ciprofloxacin (CIPRO) 250 MG tablet Take 1 tablet (250 mg total) by mouth 2 (two) times daily. 04/28/18   Lattie Haw, MD  dicyclomine (BENTYL) 20 MG tablet Take 20 mg by mouth 3 (three) times daily before meals.    [provider]  escitalopram (LEXAPRO) 20 MG tablet Take 20 mg by mouth daily.    [provider]  nitrofurantoin (MACRODANTIN) 50 MG capsule Take one cap by mouth once daily 04/28/18   Lattie Haw, MD  pantoprazole (PROTONIX) 20 MG tablet Take 20 mg by mouth daily.  [provider]    Family History Family History  Problem Relation Age of Onset  . Hypertension Father   . Diverticulitis Father     Social History Social History   Tobacco Use  . Smoking status: Former Smoker    Packs/day: 1.00    Years: 15.00    Pack years: 15.00    Types: Cigarettes  . Smokeless tobacco: Never Used  Substance Use Topics  . Alcohol use: No  . Drug use: No     Allergies   Penicillins   Review of Systems Review of Systems  Constitutional: Negative for fever.  Gastrointestinal: Negative for abdominal pain, nausea and vomiting.  Genitourinary: Positive for dysuria. Negative for flank pain and vaginal discharge.  All other systems reviewed and are negative.    Physical Exam Triage Vital Signs ED Triage Vitals  Enc Vitals Group     BP 04/28/18 1732 118/76     Pulse Rate 04/28/18 1732 74       Resp 04/28/18 1732 18     Temp 04/28/18 1732 99 F (37.2 C)     Temp Source 04/28/18 1732 Oral     SpO2 04/28/18 1732 96 %     Weight 04/28/18 1733 228 lb (103.4 kg)     Height 04/28/18 1733 5' 9.5" (1.765 m)     Head Circumference --      Peak Flow --      Pain Score 04/28/18 1733 3     Pain Loc --      Pain Edu? --      Excl. in GC? --    No data found.  Updated Vital Signs BP 118/76 (BP Location: Right Arm)   Pulse 74   Temp 99 F (37.2 C) (Oral)   Resp 18   Ht 5' 9.5" (1.765 m)   Wt 228 lb (103.4 kg)   SpO2 96%   BMI 33.19 kg/m   Visual Acuity Right Eye Distance:   Left Eye Distance:   Bilateral Distance:    Right Eye Near:   Left Eye Near:    Bilateral Near:     Physical Exam Nursing notes and Vital Signs reviewed. Appearance:  Patient appears stated age, and in no acute distress.    Eyes:  Pupils are equal, round, and reactive to light and accomodation.  Extraocular movement is intact.  Conjunctivae are not inflamed   Pharynx:  Normal; moist mucous membranes  Neck:  Supple.  No adenopathy Lungs:  Clear to auscultation.  Breath sounds are equal.  Moving air well. Heart:  Regular rate and rhythm without murmurs, rubs, or gallops.  Abdomen:  Nontender without masses or hepatosplenomegaly.  Bowel sounds are present.  No CVA or flank tenderness.  Extremities:  No edema.  Skin:  No rash present.     UC Treatments / Results  Labs (all labs ordered are listed, but only abnormal results are displayed) Labs Reviewed  POCT URINALYSIS DIP (MANUAL ENTRY) - Abnormal; Notable for the following components:      Result Value   Color, UA orange (*)    Glucose, UA =100 (*)    Bilirubin, UA small (*)    Ketones, POC UA trace (5) (*)    Blood, UA trace-intact (*)    Protein Ur, POC =30 (*)    Urobilinogen, UA 2.0 (*)    Nitrite, UA Positive (*)    Leukocytes, UA Large (3+) (*)    All other components within normal limits  URINE CULTURE     EKG None  Radiology No results found.  Procedures Procedures (including critical care time)  Medications Ordered in UC Medications - No data to display  Initial Impression / Assessment and Plan / UC Course  I have reviewed the triage vital signs and the nursing notes.  Pertinent labs & imaging results that were available during my care of the patient were reviewed by me and considered in my medical decision making (see chart for details).    Urine culture pending.  Begin Cipro  BID for five days. Rx also for Macrodantin  to resume after Cipro treatment. Followup with PCP   Final Clinical Impressions(s) / UC Diagnoses   Final diagnoses:  Dysuria  Cystitis     Discharge Instructions     Increase fluid intake. Resume taking macrodantin after finishing Cipro.    ED Prescriptions    Medication Sig Dispense Auth. Provider   ciprofloxacin (CIPRO) 250 MG tablet Take 1 tablet (250 mg total) by mouth 2 (two) times daily. 10 tablet Lattie Haw, MD   nitrofurantoin (MACRODANTIN) 50 MG capsule Take one cap by mouth once daily 30 capsule Lattie Haw, MD        Lattie Haw, MD 04/30/18 [phone]

## 2018-04-28 NOTE — Discharge Instructions (Addendum)
Increase fluid intake. Resume taking macrodantin after finishing Cipro.

## 2018-04-28 NOTE — ED Triage Notes (Addendum)
Pt c/o dysuria x 2 days. She is taking AZO. She is prescribed Macrodantin  I po QD, but has been out x 1 mth.

## 2018-04-30 ENCOUNTER — Telehealth: Payer: Self-pay

## 2018-04-30 LAB — URINE CULTURE
MICRO NUMBER:: 90551483
SPECIMEN QUALITY:: ADEQUATE

## 2018-05-12 ENCOUNTER — Emergency Department (INDEPENDENT_AMBULATORY_CARE_PROVIDER_SITE_OTHER)
Admission: EM | Admit: 2018-05-12 | Discharge: 2018-05-12 | Disposition: A | Discharge status: Home / Self Care | Payer: BLUE CROSS/BLUE SHIELD | Source: Home / Self Care | Attending: Family Medicine | Admitting: Family Medicine

## 2018-05-12 ENCOUNTER — Other Ambulatory Visit: Payer: Self-pay

## 2018-05-12 ENCOUNTER — Encounter: Payer: Self-pay | Admitting: *Deleted

## 2018-05-12 DIAGNOSIS — J020 Streptococcal pharyngitis: Secondary | ICD-10-CM | POA: Diagnosis not present

## 2018-05-12 LAB — POCT RAPID STREP A (OFFICE): RAPID STREP A SCREEN: POSITIVE — AB

## 2018-05-12 MED ORDER — CLINDAMYCIN HCL 300 MG PO CAPS: ORAL_CAPSULE | ORAL | 0 refills | Status: AC

## 2018-05-12 NOTE — Discharge Instructions (Addendum)
May take Ibuprofen , 4 tabs every 8 hours with food for sore throat, fever, etc. Try warm salt water gargles for sore throat.    If cold like symptoms develop, try the following: Take plain guaifenesin (  extended release tabs such as Mucinex) twice daily, with plenty of water, for cough and congestion.  May add Pseudoephedrine ( , one or two every 4 to 6 hours) for sinus congestion.  Get adequate rest.   May use Afrin nasal spray (or generic oxymetazoline) each morning for about 5 days and then discontinue.  Also recommend using saline nasal spray several times daily and saline nasal irrigation (AYR is a common brand).  Use Flonase nasal spray each morning after using Afrin nasal spray and saline nasal irrigation Stop all antihistamines for now, and other non-prescription cough/cold preparations. May take Delsym Cough Suppressant at bedtime for nighttime cough.

## 2018-05-12 NOTE — ED Provider Notes (Signed)
Diane Elliott CARE    CSN: [id number] Arrival date & time: 05/12/18  0858     History   Chief Complaint Chief Complaint  Patient presents with  . Cough  . Sore Throat    HPI Diane Elliott is a 42 y.o. female.   Yesterday patient developed typical cold-like symptoms including mild sore throat, sinus congestion, chills/sweats, myalgias, fatigue, and cough.   The history is provided by the patient.  Sore Throat     Past Medical History:  Diagnosis Date  . Anxiety   . Depression   . Frequent UTI   . Vaginal delivery 2000, 2003, 2009    Patient Active Problem List   Diagnosis Date Noted  . Endometrial polyp 07/08/2013  . Abnormal uterine bleeding 07/01/2013    Past Surgical History:  Procedure Laterality Date  . bladder stem stretch  98  . DILITATION & CURRETTAGE/HYSTROSCOPY WITH NOVASURE ABLATION N/A 08/11/2015   Procedure: DILATATION & CURETTAGE/HYSTEROSCOPY WITH NOVASURE ABLATION;  Surgeon: Allie Bossier, MD;  Location: [facility];  Service: Gynecology;  Laterality: N/A;  . gastric ulcer    . LAPAROSCOPY N/A 08/11/2015   Procedure: LAPAROSCOPY DIAGNOSTIC;  Surgeon: Allie Bossier, MD;  Location: [facility];  Service: Gynecology;  Laterality: N/A;  . TUBAL LIGATION  2009  . WISDOM TOOTH EXTRACTION  2013    OB History    Gravida  3   Para  3   Term      Preterm      AB      Living  3     SAB      TAB      Ectopic      Multiple      Live Births               Home Medications    Prior to Admission medications   Medication Sig Start Date End Date Taking? Authorizing Provider  buPROPion [facility] SR) 150 MG 12 hr tablet  04/27/18  Yes [provider]  escitalopram (LEXAPRO) 20 MG tablet Take 20 mg by mouth daily.   Yes [provider]  nitrofurantoin (MACRODANTIN) 50 MG capsule Take one cap by mouth once daily 04/28/18  Yes Michalina Calbert, Tera Mater, MD  omeprazole (PRILOSEC) 20 MG capsule Take 20 mg by mouth daily.   Yes [provider]  clindamycin (CLEOCIN) 300 MG capsule Take one cap by mouth every 8 hours for 10 days. 05/12/18   Lattie Haw, MD  dicyclomine (BENTYL) 20 MG tablet Take 20 mg by mouth 3 (three) times daily before meals.    [provider]    Family History Family History  Problem Relation Age of Onset  . Hypertension Father   . Diverticulitis Father     Social History Social History   Tobacco Use  . Smoking status: Former Smoker    Packs/day: 1.00    Years: 15.00    Pack years: 15.00    Types: Cigarettes  . Smokeless tobacco: Never Used  Substance Use Topics  . Alcohol use: No  . Drug use: No     Allergies   Penicillins   Review of Systems Review of Systems + sore throat + hoarse + cough No pleuritic pain No wheezing + nasal congestion + post-nasal drainage No sinus pain/pressure No itchy/red eyes ? Earache + dizzy No hemoptysis No SOB No fever, + chills/sweats No nausea No vomiting No abdominal pain No diarrhea No urinary symptoms No skin  rash + fatigue + myalgias No headache Used OTC meds without relief   Physical Exam Triage Vital Signs ED Triage Vitals  Enc Vitals Group     BP 05/12/18 0945 109/75     Pulse Rate 05/12/18 0945 85     Resp 05/12/18 0945 18     Temp 05/12/18 0945 98.4 F (36.9 C)     Temp Source 05/12/18 0945 Oral     SpO2 05/12/18 0945 99 %     Weight 05/12/18 0946 244 lb (110.7 kg)     Height 05/12/18 0946 5' 9.5" (1.765 m)     Head Circumference --      Peak Flow --      Pain Score 05/12/18 0946 0     Pain Loc --      Pain Edu? --      Excl. in [place]? --    No data found.  Updated Vital Signs BP 109/75 (BP Location: Right Arm)   Pulse 85   Temp 98.4 F (36.9 C) (Oral)   Resp 18   Ht 5' 9.5" (1.765 m)   Wt 244 lb (110.7 kg)   SpO2 99%   BMI 35.52 kg/m   Visual Acuity Right Eye Distance:   Left Eye Distance:   Bilateral Distance:    Right Eye Near:   Left Eye Near:    Bilateral Near:      Physical Exam Nursing notes and Vital Signs reviewed. Appearance:  Patient appears stated age, and in no acute distress Eyes:  Pupils are equal, round, and reactive to light and accomodation.  Extraocular movement is intact.  Conjunctivae are not inflamed  Ears:  Canals normal.  Tympanic membranes normal.  Nose:  Mildly congested turbinates.  No sinus tenderness  Pharynx:  Mildly erythematous Neck:  Supple.  Enlarged posterior/lateral nodes are palpated bilaterally, tender to palpation on the left.   Lungs:  Clear to auscultation.  Breath sounds are equal.  Moving air well. Heart:  Regular rate and rhythm without murmurs, rubs, or gallops.  Abdomen:  Nontender without masses or hepatosplenomegaly.  Bowel sounds are present.  No CVA or flank tenderness.  Extremities:  No edema.  Skin:  No rash present.    UC Treatments / Results  Labs (all labs ordered are listed, but only abnormal results are displayed) Labs Reviewed  POCT RAPID STREP A (OFFICE) - Abnormal; Notable for the following components:      Result Value   Rapid Strep A Screen Positive (*)    All other components within normal limits    EKG None  Radiology No results found.  Procedures Procedures (including critical care time)  Medications Ordered in UC Medications - No data to display  Initial Impression / Assessment and Plan / UC Course  I have reviewed the triage vital signs and the nursing notes.  Pertinent labs & imaging results that were available during my care of the patient were reviewed by me and considered in my medical decision making (see chart for details).    Suspect early viral URI also. Begin Clindamycin for 10 days. Followup with Family Doctor if not improved in 7 to 10 days.   Final Clinical Impressions(s) / UC Diagnoses   Final diagnoses:  Strep pharyngitis     Discharge Instructions     May take Ibuprofen , 4 tabs every 8 hours with food for sore throat, fever, etc. Try  warm salt water gargles for sore throat.    If cold  like symptoms develop, try the following: Take plain guaifenesin (  extended release tabs such as Mucinex) twice daily, with plenty of water, for cough and congestion.  May add Pseudoephedrine ( , one or two every 4 to 6 hours) for sinus congestion.  Get adequate rest.   May use Afrin nasal spray (or generic oxymetazoline) each morning for about 5 days and then discontinue.  Also recommend using saline nasal spray several times daily and saline nasal irrigation (AYR is a common brand).  Use Flonase nasal spray each morning after using Afrin nasal spray and saline nasal irrigation Stop all antihistamines for now, and other non-prescription cough/cold preparations. May take Delsym Cough Suppressant at bedtime for nighttime cough.       ED Prescriptions    Medication Sig Dispense Auth. Provider   clindamycin (CLEOCIN) 300 MG capsule Take one cap by mouth every 8 hours for 10 days. 30 capsule Lattie Haw, MD         Lattie Haw, MD 05/12/18 1050

## 2018-05-12 NOTE — ED Triage Notes (Signed)
Pt c/o sore throat, SOB, dizzy, bilateral ear ache and productive cough x 1 day. Last dose Tylenol at 0630.

## 2019-11-25 ENCOUNTER — Encounter: Payer: Self-pay | Admitting: *Deleted

## 2019-11-25 ENCOUNTER — Emergency Department (INDEPENDENT_AMBULATORY_CARE_PROVIDER_SITE_OTHER)
Admission: EM | Admit: 2019-11-25 | Discharge: 2019-11-25 | Disposition: A | Discharge status: Home / Self Care | Payer: BC Managed Care – PPO | Source: Home / Self Care

## 2019-11-25 ENCOUNTER — Other Ambulatory Visit: Payer: Self-pay

## 2019-11-25 DIAGNOSIS — R0981 Nasal congestion: Secondary | ICD-10-CM

## 2019-11-25 DIAGNOSIS — R05 Cough: Secondary | ICD-10-CM | POA: Diagnosis not present

## 2019-11-25 DIAGNOSIS — Z1159 Encounter for screening for other viral diseases: Secondary | ICD-10-CM

## 2019-11-25 DIAGNOSIS — R059 Cough, unspecified: Secondary | ICD-10-CM

## 2019-11-25 LAB — POC SARS CORONAVIRUS 2 AG -  ED: SARS Coronavirus 2 Ag: NEGATIVE

## 2019-11-25 NOTE — ED Provider Notes (Signed)
Diane Elliott CARE    CSN: [id number] Arrival date & time: 11/25/19  1420      History   Chief Complaint Chief Complaint  Patient presents with  . Cough    HPI Diane Elliott is a 43 y.o. female.   The history is provided by the patient. No language interpreter was used.  Cough Cough characteristics:  Dry Sputum characteristics:  Nondescript Severity:  Mild Onset quality:  Gradual Duration:  4 days Timing:  Constant Progression:  Worsening Chronicity:  New Relieved by:  Nothing Worsened by:  Nothing Associated symptoms: sinus congestion   Associated symptoms: no shortness of breath   Pt exposed to 2 people with covid  Past Medical History:  Diagnosis Date  . Anxiety   . Depression   . Frequent UTI   . Vaginal delivery 2000, 2003, 2009    Patient Active Problem List   Diagnosis Date Noted  . Endometrial polyp 07/08/2013  . Abnormal uterine bleeding 07/01/2013    Past Surgical History:  Procedure Laterality Date  . bladder stem stretch  98  . DILITATION & CURRETTAGE/HYSTROSCOPY WITH NOVASURE ABLATION N/A 08/11/2015   Procedure: DILATATION & CURETTAGE/HYSTEROSCOPY WITH NOVASURE ABLATION;  Surgeon: Allie Bossier, MD;  Location: [facility];  Service: Gynecology;  Laterality: N/A;  . gastric ulcer    . LAPAROSCOPY N/A 08/11/2015   Procedure: LAPAROSCOPY DIAGNOSTIC;  Surgeon: Allie Bossier, MD;  Location: [facility];  Service: Gynecology;  Laterality: N/A;  . TUBAL LIGATION  2009  . WISDOM TOOTH EXTRACTION  2013    OB History    Gravida  3   Para  3   Term      Preterm      AB      Living  3     SAB      TAB      Ectopic      Multiple      Live Births               Home Medications    Prior to Admission medications   Medication Sig Start Date End Date Taking? Authorizing Provider  buPROPion [facility] SR) 150 MG 12 hr tablet  04/27/18  Yes [provider]  clindamycin (CLEOCIN) 300 MG capsule Take one cap by mouth every 8 hours for  10 days. 05/12/18  Yes Lattie Haw, MD  Dexlansoprazole (DEXILANT PO) Take by mouth.   Yes [provider]  escitalopram (LEXAPRO) 20 MG tablet Take 20 mg by mouth daily.   Yes [provider]    Family History Family History  Problem Relation Age of Onset  . Hypertension Father   . Diverticulitis Father     Social History Social History   Tobacco Use  . Smoking status: Former Smoker    Packs/day: 1.00    Years: 15.00    Pack years: 15.00    Types: Cigarettes  . Smokeless tobacco: Never Used  Substance Use Topics  . Alcohol use: No  . Drug use: No     Allergies   Penicillins   Review of Systems Review of Systems  Respiratory: Positive for cough. Negative for shortness of breath.   All other systems reviewed and are negative.    Physical Exam Triage Vital Signs ED Triage Vitals  Enc Vitals Group     BP 11/25/19 1434 123/84     Pulse Rate 11/25/19 1434 66     Resp 11/25/19 1434 18  Temp 11/25/19 1434 98.5 F (36.9 C)     Temp Source 11/25/19 1434 Oral     SpO2 11/25/19 1434 98 %     Weight 11/25/19 1435 215 lb (97.5 kg)     Height 11/25/19 1435 5\' 10"  (1.778 m)     Head Circumference --      Peak Flow --      Pain Score 11/25/19 1435 0     Pain Loc --      Pain Edu? --      Excl. in [place]? --    No data found.  Updated Vital Signs BP 123/84 (BP Location: Right Arm)   Pulse 66   Temp 98.5 F (36.9 C) (Oral)   Resp 18   Ht 5\' 10"  (1.778 m)   Wt 97.5 kg   SpO2 98%   BMI 30.85 kg/m   Visual Acuity Right Eye Distance:   Left Eye Distance:   Bilateral Distance:    Right Eye Near:   Left Eye Near:    Bilateral Near:     Physical Exam Vitals signs and nursing note reviewed.  Constitutional:      Appearance: She is well-developed.  HENT:     Head: Normocephalic.     Right Ear: External ear normal.     Left Ear: External ear normal.  Neck:     Musculoskeletal: Normal range of motion.  Cardiovascular:     Rate and  Rhythm: Normal rate.  Pulmonary:     Effort: Pulmonary effort is normal.  Abdominal:     General: There is no distension.  Musculoskeletal: Normal range of motion.  Skin:    General: Skin is warm.  Neurological:     General: No focal deficit present.     Mental Status: She is alert and oriented to person, place, and time.  Psychiatric:        Mood and Affect: Mood normal.      UC Treatments / Results  Labs (all labs ordered are listed, but only abnormal results are displayed) Labs Reviewed  SARS-COV-2 RNA,(COVID-19) QUALITATIVE NAAT  POC SARS CORONAVIRUS 2 AG -  ED    EKG   Radiology No results found.  Procedures Procedures (including critical care time)  Medications Ordered in UC Medications - No data to display  Initial Impression / Assessment and Plan / UC Course  I have reviewed the triage vital signs and the nursing notes.  Pertinent labs & imaging results that were available during my care of the patient were reviewed by me and considered in my medical decision making (see chart for details).     MDM  Quick test covid negative.  Pt advised send out quest test will be 48 plus hours.  Pt advised to quarantine due to exposure and illness Final Clinical Impressions(s) / UC Diagnoses   Final diagnoses:  Cough     Discharge Instructions     Your covid test is pending and will return in 2-3 days   ED Prescriptions    None     PDMP not reviewed this encounter.  An After Visit Summary was printed and given to the patient.    Diane Elliott, [place] 11/25/19 1525

## 2019-11-25 NOTE — ED Triage Notes (Addendum)
Pt c/o cough, congestion, fatigue, hot flashes and chills x 11/30. Exposed to [place] positive persons 11/26,11/27,11/28. Took Tylenol sinus 1300.

## 2019-11-25 NOTE — Discharge Instructions (Signed)
Your covid test is pending and will return in 2-3 days

## 2019-11-28 LAB — SARS-COV-2 RNA,(COVID-19) QUALITATIVE NAAT: SARS CoV2 RNA: NOT DETECTED

## 2020-02-01 ENCOUNTER — Emergency Department (INDEPENDENT_AMBULATORY_CARE_PROVIDER_SITE_OTHER)
Admission: EM | Admit: 2020-02-01 | Discharge: 2020-02-01 | Disposition: A | Discharge status: Home / Self Care | Payer: BC Managed Care – PPO | Source: Home / Self Care | Attending: Family Medicine | Admitting: Family Medicine

## 2020-02-01 ENCOUNTER — Other Ambulatory Visit: Payer: Self-pay

## 2020-02-01 DIAGNOSIS — J069 Acute upper respiratory infection, unspecified: Secondary | ICD-10-CM

## 2020-02-01 DIAGNOSIS — R430 Anosmia: Secondary | ICD-10-CM | POA: Diagnosis not present

## 2020-02-01 NOTE — Discharge Instructions (Addendum)
Take plain guaifenesin (1200mg extended release tabs such as Mucinex) twice daily, with plenty of water, for cough and congestion.  May add Pseudoephedrine (30mg, one or two every 4 to 6 hours) for sinus congestion.  Get adequate rest.   May take Delsym Cough Suppressant at bedtime for nighttime cough.  Try warm salt water gargles for sore throat.  Stop all antihistamines for now, and other non-prescription cough/cold preparations. May take Ibuprofen 200mg, 4 tabs every 8 hours with food for body aches, headache, etc.   Isolate yourself until COVID-19 test result is available.   If your COVID19 test is positive, then you are infected with the novel coronavirus and could give the virus to others.  Please continue isolation at home for at least 10 days since the start of your symptoms. Once you complete your 10 day quarantine, you may return to normal activities as long as you've not had a fever for over 24 hours (without taking fever reducing medicine) and your symptoms are improving. Please continue good preventive care measures, including:  frequent hand-washing, avoid touching your face, cover coughs/sneezes, stay out of crowds and keep a 6 foot distance from others.  Go to the nearest hospital emergency room if fever/cough/breathlessness are severe or illness seems like a threat to life.  

## 2020-02-01 NOTE — ED Provider Notes (Signed)
Vinnie Langton CARE    CSN: [id number] Arrival date & time: 02/01/20  1716      History   Chief Complaint Chief Complaint  Patient presents with  . Headache  . Shortness of Breath  . lack of taste and smell  . Fatigue    HPI Diane Elliott is a 44 y.o. female.   Patient developed a severe headache five days ago, followed by fatigue, sinus congestion, sore throat, sweats, and an intermittent cough.  She has noticed a decrease in her sense of taste/smell.  She has had mild shortness of breath with activity and a sense of tightness in her anterior chest.  The history is provided by the patient.    Past Medical History:  Diagnosis Date  . Anxiety   . Depression   . Frequent UTI   . Vaginal delivery 2000, 2003, 2009    Patient Active Problem List   Diagnosis Date Noted  . Endometrial polyp 07/08/2013  . Abnormal uterine bleeding 07/01/2013    Past Surgical History:  Procedure Laterality Date  . bladder stem stretch  98  . DILITATION & CURRETTAGE/HYSTROSCOPY WITH NOVASURE ABLATION N/A 08/11/2015   Procedure: DILATATION & CURETTAGE/HYSTEROSCOPY WITH NOVASURE ABLATION;  Surgeon: Emily Filbert, MD;  Location: [place] [facility];  Service: Gynecology;  Laterality: N/A;  . gastric ulcer    . LAPAROSCOPY N/A 08/11/2015   Procedure: LAPAROSCOPY DIAGNOSTIC;  Surgeon: Emily Filbert, MD;  Location: [facility];  Service: Gynecology;  Laterality: N/A;  . TUBAL LIGATION  2009  . WISDOM TOOTH EXTRACTION  2013    OB History    Gravida  3   Para  3   Term      Preterm      AB      Living  3     SAB      TAB      Ectopic      Multiple      Live Births               Home Medications    Prior to Admission medications   Medication Sig Start Date End Date Taking? Authorizing Provider  nitrofurantoin (MACRODANTIN) 50 MG capsule Take by mouth. 07/06/19  Yes [provider]  buPROPion (WELLBUTRIN SR) 150 MG 12 hr tablet  04/27/18   [provider]  clindamycin  (CLEOCIN) 300 MG capsule Take one cap by mouth every 8 hours for 10 days. 05/12/18   Kandra Nicolas, MD  Dexlansoprazole (DEXILANT PO) Take by mouth.    [provider]  escitalopram (LEXAPRO) 20 MG tablet Take 20 mg by mouth daily.    [provider]    Family History Family History  Problem Relation Age of Onset  . Hypertension Father   . Diverticulitis Father     Social History Social History   Tobacco Use  . Smoking status: Former Smoker    Packs/day: 1.00    Years: 15.00    Pack years: 15.00    Types: Cigarettes  . Smokeless tobacco: Never Used  Substance Use Topics  . Alcohol use: No  . Drug use: No     Allergies   Penicillins   Review of Systems Review of Systems + sore throat + cough No pleuritic pain No wheezing + nasal congestion + post-nasal drainage No sinus pain/pressure No itchy/red eyes No earache No hemoptysis + SOB No fever, + chills/sweats No nausea No vomiting No abdominal pain + intermittent diarrhea No  urinary symptoms No skin rash + fatigue + myalgias + headache Used OTC meds (Nyquil) without relief   Physical Exam Triage Vital Signs ED Triage Vitals  Enc Vitals Group     BP 02/01/20 1755 115/83     Pulse Rate 02/01/20 1755 71     Resp 02/01/20 1755 20     Temp 02/01/20 1755 98.6 F (37 C)     Temp Source 02/01/20 1755 Oral     SpO2 02/01/20 1755 99 %     Weight 02/01/20 1752 200 lb (90.7 kg)     Height 02/01/20 1752 5' 9.5" (1.765 m)     Head Circumference --      Peak Flow --      Pain Score 02/01/20 1751 2     Pain Loc --      Pain Edu? --      Excl. in [place]? --    No data found.  Updated Vital Signs BP 115/83 (BP Location: Left Arm)   Pulse 71   Temp 98.6 F (37 C) (Oral)   Resp 20   Ht 5' 9.5" (1.765 m)   Wt 90.7 kg   SpO2 99%   BMI 29.11 kg/m   Visual Acuity Right Eye Distance:   Left Eye Distance:   Bilateral Distance:    Right Eye Near:   Left Eye Near:    Bilateral Near:      Physical Exam Nursing notes and Vital Signs reviewed. Appearance:  Patient appears stated age, and in no acute distress Eyes:  Pupils are equal, round, and reactive to light and accomodation.  Extraocular movement is intact.  Conjunctivae are not inflamed  Ears:  Canals normal.  Tympanic membranes normal.  Nose:  Mildly congested turbinates.  No sinus tenderness.  Pharynx:  Normal Neck:  Supple.  Mildly enlarged lateral nodes are present, tender to palpation on the left.   Lungs:  Clear to auscultation.  Breath sounds are equal.  Moving air well. Heart:  Regular rate and rhythm without murmurs, rubs, or gallops.  Abdomen:  Nontender without masses or hepatosplenomegaly.  Bowel sounds are present.  No CVA or flank tenderness.  Extremities:  No edema.  Skin:  No rash present.   UC Treatments / Results  Labs (all labs ordered are listed, but only abnormal results are displayed) Labs Reviewed  NOVEL CORONAVIRUS, NAA    EKG   Radiology No results found.  Procedures Procedures (including critical care time)  Medications Ordered in UC Medications - No data to display  Initial Impression / Assessment and Plan / UC Course  I have reviewed the triage vital signs and the nursing notes.  Pertinent labs & imaging results that were available during my care of the patient were reviewed by me and considered in my medical decision making (see chart for details).    Benign exam.  There is no evidence of bacterial infection today.  Treat symptomatically for now. COVID19 send out   Final Clinical Impressions(s) / UC Diagnoses   Final diagnoses:  Loss of smell  Viral URI with cough     Discharge Instructions     Take plain guaifenesin (1200mg  extended release tabs such as Mucinex) twice daily, with plenty of water, for cough and congestion.  May add Pseudoephedrine (30mg , one or two every 4 to 6 hours) for sinus congestion.  Get adequate rest.   May take Delsym Cough Suppressant  at bedtime for nighttime cough.  Try warm salt water gargles  for sore throat.  Stop all antihistamines for now, and other non-prescription cough/cold preparations. May take Ibuprofen 200mg , 4 tabs every 8 hours with food for body aches, headache, etc.  Isolate yourself until COVID-19 test result is available.   If your COVID19 test is positive, then you are infected with the novel coronavirus and could give the virus to others.  Please continue isolation at home for at least 10 days since the start of your symptoms. Once you complete your 10 day quarantine, you may return to normal activities as long as you've not had a fever for over 24 hours (without taking fever reducing medicine) and your symptoms are improving. Please continue good preventive care measures, including:  frequent hand-washing, avoid touching your face, cover coughs/sneezes, stay out of crowds and keep a 6 foot distance from others.  Go to the nearest hospital emergency room if fever/cough/breathlessness are severe or illness seems like a threat to life.       ED Prescriptions    None        , MD 02/03/20 2039

## 2020-02-01 NOTE — ED Triage Notes (Signed)
Started with migraine last Wednesday.  Has had fatigue, sob, loss of taste and smell, and retaining fluid.  States that fingers are swelling to the point she is unable to remove rings.

## 2020-02-02 LAB — NOVEL CORONAVIRUS, NAA: SARS-CoV-2, NAA: DETECTED — AB

## 2020-02-03 ENCOUNTER — Encounter (HOSPITAL_COMMUNITY): Payer: Self-pay

## 2020-02-03 ENCOUNTER — Telehealth (HOSPITAL_COMMUNITY): Payer: Self-pay | Admitting: Emergency Medicine

## 2020-02-03 NOTE — Telephone Encounter (Signed)
